# Patient Record
Sex: Male | Born: 1935 | Race: Asian | Hispanic: No | Marital: Married | Smoking: Former smoker
Health system: Southern US, Community
[De-identification: ages and names within clinical notes are randomized; demographics above are authoritative.]

## PROBLEM LIST (undated history)

## (undated) DIAGNOSIS — H9193 Unspecified hearing loss, bilateral: Secondary | ICD-10-CM

## (undated) DIAGNOSIS — F411 Generalized anxiety disorder: Secondary | ICD-10-CM

## (undated) DIAGNOSIS — K5792 Diverticulitis of intestine, part unspecified, without perforation or abscess without bleeding: Secondary | ICD-10-CM

## (undated) DIAGNOSIS — E785 Hyperlipidemia, unspecified: Secondary | ICD-10-CM

## (undated) DIAGNOSIS — K589 Irritable bowel syndrome without diarrhea: Secondary | ICD-10-CM

## (undated) DIAGNOSIS — F329 Major depressive disorder, single episode, unspecified: Secondary | ICD-10-CM

## (undated) DIAGNOSIS — R7302 Impaired glucose tolerance (oral): Secondary | ICD-10-CM

## (undated) DIAGNOSIS — G4700 Insomnia, unspecified: Secondary | ICD-10-CM

## (undated) DIAGNOSIS — R64 Cachexia: Secondary | ICD-10-CM

## (undated) DIAGNOSIS — N400 Enlarged prostate without lower urinary tract symptoms: Secondary | ICD-10-CM

## (undated) HISTORY — DX: Insomnia, unspecified: G47.00

## (undated) HISTORY — DX: Irritable bowel syndrome without diarrhea: K58.9

## (undated) HISTORY — DX: Cachexia: R64

## (undated) HISTORY — DX: Generalized anxiety disorder: F41.1

## (undated) HISTORY — DX: Unspecified hearing loss, bilateral: H91.93

## (undated) HISTORY — DX: Impaired glucose tolerance (oral): R73.02

## (undated) HISTORY — DX: Benign prostatic hyperplasia without lower urinary tract symptoms: N40.0

## (undated) HISTORY — DX: Diverticulitis of intestine, part unspecified, without perforation or abscess without bleeding: K57.92

## (undated) HISTORY — DX: Hyperlipidemia, unspecified: E78.5

## (undated) HISTORY — DX: Major depressive disorder, single episode, unspecified: F32.9

## (undated) HISTORY — PX: PROSTATE SURGERY: SHX751

## (undated) HISTORY — PX: SHOULDER SURGERY: SHX246

---

## 1999-05-06 ENCOUNTER — Emergency Department (HOSPITAL_COMMUNITY): Admission: EM | Admit: 1999-05-06 | Discharge: 1999-05-06 | Payer: Self-pay | Admitting: Emergency Medicine

## 2003-08-16 ENCOUNTER — Encounter: Admission: RE | Admit: 2003-08-16 | Discharge: 2003-11-14 | Payer: Self-pay | Admitting: Orthopaedic Surgery

## 2010-05-13 ENCOUNTER — Emergency Department (HOSPITAL_COMMUNITY): Admission: EM | Admit: 2010-05-13 | Discharge: 2010-05-13 | Payer: Self-pay | Admitting: Internal Medicine

## 2013-01-25 ENCOUNTER — Telehealth: Payer: Self-pay | Admitting: Internal Medicine

## 2013-01-25 NOTE — Telephone Encounter (Signed)
Ok with me, this time only, thanks

## 2013-01-25 NOTE — Telephone Encounter (Signed)
Pt called stated his friend, Jacob Mason, is a pt of Dr. Jonny Mason and last ov he had with Dr. Jonny Mason Mr. Jacob Mason ask if Dr. Jonny Mason would take on Jacob Mason as a new pt and Dr. Jonny Mason said yes. Please advise if this is ok.

## 2013-01-26 NOTE — Telephone Encounter (Signed)
LMOM TO CALL  

## 2013-01-27 NOTE — Telephone Encounter (Signed)
Appt on July 17

## 2013-02-23 ENCOUNTER — Encounter: Payer: Self-pay | Admitting: Internal Medicine

## 2013-02-23 ENCOUNTER — Other Ambulatory Visit (INDEPENDENT_AMBULATORY_CARE_PROVIDER_SITE_OTHER): Payer: Medicare Other

## 2013-02-23 ENCOUNTER — Ambulatory Visit (INDEPENDENT_AMBULATORY_CARE_PROVIDER_SITE_OTHER): Payer: Medicare Other | Admitting: Internal Medicine

## 2013-02-23 ENCOUNTER — Ambulatory Visit (INDEPENDENT_AMBULATORY_CARE_PROVIDER_SITE_OTHER)
Admission: RE | Admit: 2013-02-23 | Discharge: 2013-02-23 | Disposition: A | Discharge status: Home / Self Care | Payer: Medicare Other | Source: Ambulatory Visit | Attending: Internal Medicine | Admitting: Internal Medicine

## 2013-02-23 VITALS — BP 102/62 | HR 55 | Temp 97.6°F | Ht 63.0 in | Wt 103.8 lb

## 2013-02-23 DIAGNOSIS — R634 Abnormal weight loss: Secondary | ICD-10-CM

## 2013-02-23 DIAGNOSIS — F411 Generalized anxiety disorder: Secondary | ICD-10-CM

## 2013-02-23 DIAGNOSIS — N400 Enlarged prostate without lower urinary tract symptoms: Secondary | ICD-10-CM

## 2013-02-23 DIAGNOSIS — H9193 Unspecified hearing loss, bilateral: Secondary | ICD-10-CM

## 2013-02-23 DIAGNOSIS — Z0001 Encounter for general adult medical examination with abnormal findings: Secondary | ICD-10-CM | POA: Insufficient documentation

## 2013-02-23 DIAGNOSIS — K5792 Diverticulitis of intestine, part unspecified, without perforation or abscess without bleeding: Secondary | ICD-10-CM | POA: Insufficient documentation

## 2013-02-23 DIAGNOSIS — R109 Unspecified abdominal pain: Secondary | ICD-10-CM

## 2013-02-23 HISTORY — DX: Unspecified hearing loss, bilateral: H91.93

## 2013-02-23 HISTORY — DX: Generalized anxiety disorder: F41.1

## 2013-02-23 LAB — CBC WITH DIFFERENTIAL/PLATELET
Basophils Relative: 0.3 % (ref 0.0–3.0)
Eosinophils Absolute: 0.1 10*3/uL (ref 0.0–0.7)
Eosinophils Relative: 1.1 % (ref 0.0–5.0)
HCT: 40.4 % (ref 39.0–52.0)
Lymphs Abs: 1.1 10*3/uL (ref 0.7–4.0)
MCHC: 34.3 g/dL (ref 30.0–36.0)
MCV: 96.9 fl (ref 78.0–100.0)
Monocytes Absolute: 0.4 10*3/uL (ref 0.1–1.0)
Neutrophils Relative %: 65 % (ref 43.0–77.0)
Platelets: 197 10*3/uL (ref 150.0–400.0)
RBC: 4.17 Mil/uL — ABNORMAL LOW (ref 4.22–5.81)
WBC: 4.7 10*3/uL (ref 4.5–10.5)

## 2013-02-23 LAB — URINALYSIS, ROUTINE W REFLEX MICROSCOPIC
Bilirubin Urine: NEGATIVE
Ketones, ur: NEGATIVE
RBC / HPF: NONE SEEN (ref 0–?)
Specific Gravity, Urine: 1.025 (ref 1.000–1.030)
Total Protein, Urine: NEGATIVE
pH: 7 (ref 5.0–8.0)

## 2013-02-23 LAB — BASIC METABOLIC PANEL
Chloride: 101 mEq/L (ref 96–112)
GFR: 114.75 mL/min (ref >60.00)
Potassium: 4.3 mEq/L (ref 3.5–5.1)

## 2013-02-23 LAB — PSA: PSA: 0.72 ng/mL (ref 0.10–4.00)

## 2013-02-23 LAB — HEPATIC FUNCTION PANEL
ALT: 22 U/L (ref 0–53)
AST: 27 U/L (ref 0–37)
Bilirubin, Direct: 0 mg/dL (ref 0.0–0.3)
Total Bilirubin: 0.7 mg/dL (ref 0.3–1.2)

## 2013-02-23 LAB — LIPASE: Lipase: 22 U/L (ref 11.0–59.0)

## 2013-02-23 MED ORDER — FINASTERIDE 5 MG PO TABS: 5.0000 mg | ORAL_TABLET | Freq: Every day | ORAL | Status: DC

## 2013-02-23 MED ORDER — MIRTAZAPINE 15 MG PO TABS: 15.0000 mg | ORAL_TABLET | Freq: Every day | ORAL | Status: DC

## 2013-02-23 NOTE — Assessment & Plan Note (Signed)
To cont remeron for now, stable overall by history and exam, and pt to continue medical treatment as before,  to f/u any worsening symptoms or concerns

## 2013-02-23 NOTE — Assessment & Plan Note (Signed)
C/w functional pain most likely, for labs today and refer GI per pt request

## 2013-02-23 NOTE — Assessment & Plan Note (Signed)
Suspect geriatric decline, but for cxr today as well

## 2013-02-23 NOTE — Assessment & Plan Note (Signed)
stable overall by history and exam,  and pt to continue medical treatment as before,  to f/u any worsening symptoms or concerns, for med refill 

## 2013-02-23 NOTE — Patient Instructions (Addendum)
Please continue all other medications as before, and refills have been done if requested. Please have the pharmacy call with any other refills you may need. Please continue your efforts at being more active, low cholesterol diet, and weight control. You will be contacted regarding the referral for: GI Please go to the LAB in the Basement (turn left off the elevator) for the tests to be done today Please go to the XRAY Department in the Basement (go straight as you get off the elevator) for the x-ray testing You will be contacted by phone if any changes need to be made immediately.  Otherwise, you will receive a letter about your results with an explanation, but please check with MyChart first.  Please remember to sign up for My Chart if you have not done so, as this will be important to you in the future with finding out test results, communicating by private email, and scheduling acute appointments online when needed.  Please return in 6 months, or sooner if needed

## 2013-02-23 NOTE — Progress Notes (Signed)
Subjective:    Patient ID: Jacob Mason, male    DOB: [birth year], 79 y.o.   MRN: [id number]  HPI  Here to establish as new pt;  Having some difficulty maintaining wt as has discomfort more in lower abd with eating, sometimes has to to have BM twice per day, unusual for him, no dysphagia, n/v, radiation, melena; has intermittent constipation and occas small hematochezia (1-2 times per mo) for several months.  Overall states GI complaints as above have been intermittent for 30 yrs.  Has seen GI at [facility],  Had "radioactive egg" test but test showed "too fast" results. Tried otc nexium for 10 days before coming here, no help. Thinks he has lost possibly 30 lbs over many yrs (was about 140 in past yrs), possibly 10 of that in the past yr.  Has had several EGD and colonscopy in past, "ther's nothing wrong with you" and suggested more functional and emotional related; was tx with mirtazipine but not helping GI complaints. Does help sleeping for several yrs.  Former smoker but not recent, drinks few ETOH - 1 drink with dinner some nights only.  No other complaints.  Takes the finasteride for BPH, overall doing ok, gets up one time per night.  His urologist died so not seeing now.  Tries to exercise on a regular basis with walking, cant gain wt. No longer seeing a GI in [place] he saw many yrs ago.  Denies worsening depressive symptoms, suicidal ideation Past Medical History  Diagnosis Date  . Diverticulitis   . BPH (benign prostatic hypertrophy)   . Bilateral hearing loss 02/23/2013   Past Surgical History  Procedure Laterality Date  . Prostate surgery    . Shoulder surgery      reports that he has quit smoking. He does not have any smokeless tobacco history on file. He reports that  drinks alcohol. He reports that he does not use illicit drugs. family history is not on file. Family history is unknown by patient. No Known Allergies No current outpatient prescriptions on file prior to visit.    No current facility-administered medications on file prior to visit.   Review of Systems Constitutional: Negative for diaphoresis, activity change, appetite change or unexpected weight change.  HENT: Negative for hearing loss, ear pain, facial swelling, mouth sores and neck stiffness.   Eyes: Negative for pain, redness and visual disturbance.  Respiratory: Negative for shortness of breath and wheezing.   Cardiovascular: Negative for chest pain and palpitations.  Gastrointestinal: Negative for diarrhea, blood in stool, abdominal distention or other pain Genitourinary: Negative for hematuria, flank pain or change in urine volume.  Musculoskeletal: Negative for myalgias and joint swelling.  Skin: Negative for color change and wound.  Neurological: Negative for syncope and numbness. other than noted Hematological: Negative for adenopathy.  Psychiatric/Behavioral: Negative for hallucinations, self-injury, decreased concentration and agitation.      Objective:   Physical Exam BP 102/62  Pulse 55  Temp(Src) 97.6 F (36.4 C) (Oral)  Ht 5\' 3"  (1.6 m)  Wt 103 lb 12 oz (47.061 kg)  BMI 18.38 kg/m2  SpO2 97% VS noted,  Constitutional: Pt is oriented to person, place, and time. Appears well-developed and well-nourished.  Head: Normocephalic and atraumatic.  Right Ear: External ear normal.  Left Ear: External ear normal.  Nose: Nose normal.  Mouth/Throat: Oropharynx is clear and moist.  Eyes: Conjunctivae and EOM are normal. Pupils are equal, round, and reactive to light.  Neck: Normal range of motion.  Neck supple. No JVD present. No tracheal deviation present.  Cardiovascular: Normal rate, regular rhythm, normal heart sounds and intact distal pulses.   Pulmonary/Chest: Effort normal and breath sounds normal.  Abdominal: Soft. Bowel sounds are normal. There is no tenderness. No HSM  Musculoskeletal: Normal range of motion. Exhibits no edema.  Lymphadenopathy:  Has no cervical  adenopathy.  Neurological: Pt is alert and oriented to person, place, and time. Pt has normal reflexes. No cranial nerve deficit.  Skin: Skin is warm and dry. No rash noted.  Psychiatric:  Has  normal mood and affect. Behavior is normal. mild nervous    Assessment & Plan:

## 2013-03-06 ENCOUNTER — Encounter: Payer: Self-pay | Admitting: Gastroenterology

## 2013-03-14 ENCOUNTER — Encounter: Payer: Self-pay | Admitting: Internal Medicine

## 2013-03-14 DIAGNOSIS — R7302 Impaired glucose tolerance (oral): Secondary | ICD-10-CM

## 2013-03-14 DIAGNOSIS — K589 Irritable bowel syndrome without diarrhea: Secondary | ICD-10-CM

## 2013-03-14 DIAGNOSIS — G4700 Insomnia, unspecified: Secondary | ICD-10-CM

## 2013-03-14 DIAGNOSIS — F32A Depression, unspecified: Secondary | ICD-10-CM

## 2013-03-14 DIAGNOSIS — R64 Cachexia: Secondary | ICD-10-CM

## 2013-03-14 DIAGNOSIS — F329 Major depressive disorder, single episode, unspecified: Secondary | ICD-10-CM | POA: Insufficient documentation

## 2013-03-14 HISTORY — DX: Irritable bowel syndrome, unspecified: K58.9

## 2013-03-14 HISTORY — DX: Insomnia, unspecified: G47.00

## 2013-03-14 HISTORY — DX: Cachexia: R64

## 2013-03-14 HISTORY — DX: Depression, unspecified: F32.A

## 2013-03-14 HISTORY — DX: Impaired glucose tolerance (oral): R73.02

## 2013-03-21 ENCOUNTER — Other Ambulatory Visit (INDEPENDENT_AMBULATORY_CARE_PROVIDER_SITE_OTHER): Payer: Medicare Other

## 2013-03-21 ENCOUNTER — Ambulatory Visit (INDEPENDENT_AMBULATORY_CARE_PROVIDER_SITE_OTHER): Payer: Medicare Other | Admitting: Gastroenterology

## 2013-03-21 ENCOUNTER — Encounter: Payer: Self-pay | Admitting: Gastroenterology

## 2013-03-21 VITALS — BP 110/60 | HR 72 | Ht 63.0 in | Wt 102.0 lb

## 2013-03-21 DIAGNOSIS — R142 Eructation: Secondary | ICD-10-CM

## 2013-03-21 DIAGNOSIS — R141 Gas pain: Secondary | ICD-10-CM

## 2013-03-21 DIAGNOSIS — R140 Abdominal distension (gaseous): Secondary | ICD-10-CM

## 2013-03-21 LAB — IBC PANEL
Iron: 142 ug/dL (ref 42–165)
Transferrin: 245.4 mg/dL (ref 212.0–360.0)

## 2013-03-21 LAB — FERRITIN: Ferritin: 75.2 ng/mL (ref 22.0–322.0)

## 2013-03-21 NOTE — Patient Instructions (Signed)
It has been recommended to you by your physician that you have a(n) Endoscopy and Colonoscopy completed. Per your request, we did not schedule the procedure(s) today. Please contact our office at [phone] should you decide to have the procedure completed.  Your physician has requested that you go to the basement for the following lab work before leaving today: Anemia Panel  Celiac Panel  Information on Gastroparesis is below ________________________________________________________________________  Gastroparesis  Gastroparesis is also called slowed stomach emptying (delayed gastric emptying). It is a condition in which the stomach takes too long to empty its contents. It often happens in people with diabetes.  CAUSES  Gastroparesis happens when nerves to the stomach are damaged or stop working. When the nerves are damaged, the muscles of the stomach and intestines do not work normally. The movement of food is slowed or stopped. High blood glucose (sugar) causes changes in nerves and can damage the blood vessels that carry oxygen and nutrients to the nerves. RISK FACTORS  Diabetes.  Post-viral syndromes.  Eating disorders (anorexia, bulimia).  Surgery on the stomach or vagus nerve.  Gastroesophageal reflux disease (rarely).  Smooth muscle disorders (amyloidosis, scleroderma).  Metabolic disorders, including hypothyroidism.  Parkinson's disease. SYMPTOMS   Heartburn.  Feeling sick to your stomach (nausea).  Vomiting of undigested food.  An early feeling of fullness when eating.  Weight loss.  Abdominal bloating.  Erratic blood glucose levels.  Lack of appetite.  Gastroesophageal reflux.  Spasms of the stomach wall. Complications can include:  Bacterial overgrowth in stomach. Food stays in the stomach and can ferment and cause bacteria to grow.  Weight loss due to difficulty digesting and absorbing nutrients.  Vomiting.  Obstruction in the stomach.  Undigested food can harden and cause nausea and vomiting.  Blood glucose fluctuations caused by inconsistent food absorption. DIAGNOSIS  The diagnosis of gastroparesis is confirmed through one or more of the following tests:  Barium X-rays and scans. These tests look at how long it takes for food to move through the stomach.  Gastric manometry. This test measures electrical and muscular activity in the stomach. A thin tube is passed down the throat into the stomach. The tube contains a wire that takes measurements of the stomach's electrical and muscular activity as it digests liquids and solid food.  Endoscopy. This procedure is done with a long, thin tube called an endoscope. It is passed through the mouth and gently guides down the esophagus into the stomach. This tube helps the caregiver look at the lining of the stomach to check for any abnormalities.  Ultrasound. This can rule out gallbladder disease or pancreatitis. This test will outline and define the shape of the gallbladder and pancreas. TREATMENT   The primary treatment is to identify the problem and help control blood glucose levels. Treatments include:  Exercise.  Medicines to control nausea and vomiting.  Medicines to stimulate stomach muscles.  Changes in what and when you eat.  Having smaller meals more often.  Eating low-fiber forms of high-fiber foods, such aseating cooked vegetables instead of raw vegetables.  Eating low-fat foods.  Consuming liquids, which are easier to digest.  In severe cases, feeding tubes and intravenous (IV) feeding may be needed. It is important to note that in most cases, treatment does not cure gastroparesis. It is usually a lasting (chronic) condition. Treatment helps you manage the condition so that you can be as healthy and comfortable as possible. NEW TREATMENTS  A gastric neurostimulator has been developed to assist  people with gastroparesis. The battery-operated device is  surgically implanted. It emits mild electrical pulses to help improve stomach emptying and to control nausea and vomiting.  The use of botulinum toxin has been shown to improve stomach emptying by decreasing the prolonged contractions of the muscle between the stomach and the small intestine (pyloric sphincter). The benefits are temporary. SEEK MEDICAL CARE IF:   You are having problems keeping your blood glucose in goal range.  You are having nausea, vomiting, bloating, or early feelings of fullness with eating.  Your symptoms do not change with a change in diet. Document Released: 07/27/2005 Document Revised: 10/19/2011 Document Reviewed: 01/03/2009 [facility] Patient Information 2014 [place], [place].

## 2013-03-21 NOTE — Progress Notes (Signed)
History of Present Illness:  This is a 79 year old Korean-American retired patient who has had GI problems for over 30 years evaluated previously by Dr. Dorna Bloom at [facility] including several endoscopies and colonoscopies.  I do not have these records for review.  The patient now relates that he has early satiety, questionable weight loss over 30 years, minimal gas and bloating, no change in his bowel habits, no dysphagia, no hepatobiliary complaints.  He specific denies melena, hematochezia, or any systemic complaints such as fever, chills, skin rash, joint pains et Karie Soda.  He has questionable lactose intolerance, and denies other food intolerances.  He says that his early satiety is approved by constant exercise during the day.  He sleeps well at night.  Patient denies abuse of alcohol, cigarettes, or NSAIDs.  He takes Remeron 15 mg a day, and a variety of supplements.  No history of NSAIDs, alcohol, or cigarette abuse at this time.  I have reviewed this patient's present history, medical and surgical past history, allergies and medications.     ROS:   All systems were reviewed and are negative unless otherwise stated in the HPI.  No Known Allergies Outpatient Prescriptions Prior to Visit  Medication Sig Dispense Refill  . calcium gluconate 500 MG tablet Take 500 mg by mouth daily.      . finasteride (PROSCAR) 5 MG tablet Take 1 tablet (5 mg total) by mouth daily.  90 tablet  3  . Flax OIL Take by mouth daily.      . Lutein 20 MG TABS Take by mouth daily.      . mirtazapine (REMERON) 15 MG tablet Take 1 tablet (15 mg total) by mouth at bedtime.  90 tablet  3  . Multiple Vitamin (MULTIVITAMIN) tablet Take 1 tablet by mouth daily.      . Cholecalciferol (VITAMIN D3) 2000 UNITS TABS Take by mouth daily.       No facility-administered medications prior to visit.   Past Medical History  Diagnosis Date  . Diverticulitis   . BPH (benign prostatic hypertrophy)   . Bilateral hearing loss  02/23/2013  . Anxiety state, unspecified 02/23/2013  . Irritable bowel 03/14/2013  . Insomnia 03/14/2013  . Depression 03/14/2013  . Impaired glucose tolerance 03/14/2013  . Cachexia 03/14/2013   Past Surgical History  Procedure Laterality Date  . Prostate surgery    . Shoulder surgery     History   Social History  . Marital Status: Married    Spouse Name: N/A    Number of Children: N/A  . Years of Education: PHD   Occupational History  . Retired    Social History Main Topics  . Smoking status: Former Games developer  . Smokeless tobacco: Never Used  . Alcohol Use: Yes  . Drug Use: No  . Sexually Active: None   Other Topics Concern  . None   Social History Narrative  . None   History reviewed. No pertinent family history.     Physical Exam: Blood pressure 110/60, pulse 72 and regular weight 182 with a BMI of 18.07.  He is a former smoker General well developed well nourished patient in no acute distress, appearing their stated age Eyes PERRLA, no icterus, fundoscopic exam per opthamologist Skin no lesions noted Neck supple, no adenopathy, no thyroid enlargement, no tenderness Chest clear to percussion and auscultation Heart no significant murmurs, gallops or rubs noted Abdomen no hepatosplenomegaly masses or tenderness, BS normal.  Rectal inspection normal no fissures, or fistulae  noted.  No masses or tenderness on digital exam. Stool guaiac negative.  No abdominal distention, organomegaly, masses or tenderness.  Bowel sounds are entirely normal. Extremities no acute joint lesions, edema, phlebitis or evidence of cellulitis. Neurologic patient oriented x 3, cranial nerves intact, no focal neurologic deficits noted. Psychological mental status normal and normal affect.  Assessment and plan: Probable mild gastroparesis which is been a chronic problem for this patient over 30 years.  I am willing to repeat GI evaluation with endoscopy and colonoscopy, but he has refused these tests.  He  is convinced that he has rapid intestinal emptying has previously told by Dr. Dorna Bloom.  He has no worrisome red flag signals of malignancy, and I have not pushed him to have any particular test done, but we will check celiac serology and I placed him on a  gastroparesis diet trial.  Apparently, in the past he has tried probiotics without improvement.  It may be best that this patient return to [facility] for his ongoing GI care.  I doubt he has underlying occult malignancy.  Review of his labs shows normal CBC and general metabolic and liver profiles.  He has never had hepatitis, pancreatitis, or hepatobiliary problems.

## 2013-03-22 LAB — CELIAC PANEL 10
Gliadin IgG: 11.2 U/mL (ref <20)
Tissue Transglut Ab: 6.3 U/mL (ref <20)
Tissue Transglutaminase Ab, IgA: 3.3 U/mL (ref <20)

## 2013-04-12 ENCOUNTER — Telehealth: Payer: Self-pay | Admitting: Gastroenterology

## 2013-04-13 ENCOUNTER — Telehealth: Payer: Self-pay | Admitting: Gastroenterology

## 2013-04-13 NOTE — Telephone Encounter (Signed)
Mailed results  

## 2013-04-13 NOTE — Telephone Encounter (Signed)
In previous note, pt had requested lab results be mailed to him. Done.

## 2013-04-13 NOTE — Telephone Encounter (Signed)
lmom for pt to call back

## 2013-06-15 ENCOUNTER — Other Ambulatory Visit: Payer: Self-pay

## 2013-09-19 ENCOUNTER — Ambulatory Visit: Payer: Medicare Other | Admitting: Internal Medicine

## 2013-09-20 ENCOUNTER — Ambulatory Visit: Payer: Medicare Other | Admitting: Internal Medicine

## 2013-09-29 ENCOUNTER — Ambulatory Visit: Payer: Medicare Other | Admitting: Internal Medicine

## 2013-10-13 ENCOUNTER — Ambulatory Visit (INDEPENDENT_AMBULATORY_CARE_PROVIDER_SITE_OTHER): Payer: Medicare Other | Admitting: Internal Medicine

## 2013-10-13 ENCOUNTER — Encounter: Payer: Self-pay | Admitting: Internal Medicine

## 2013-10-13 VITALS — BP 118/70 | HR 70 | Temp 98.4°F | Ht 63.0 in | Wt 107.2 lb

## 2013-10-13 DIAGNOSIS — F411 Generalized anxiety disorder: Secondary | ICD-10-CM

## 2013-10-13 DIAGNOSIS — Z0000 Encounter for general adult medical examination without abnormal findings: Secondary | ICD-10-CM

## 2013-10-13 DIAGNOSIS — F329 Major depressive disorder, single episode, unspecified: Secondary | ICD-10-CM

## 2013-10-13 DIAGNOSIS — F32A Depression, unspecified: Secondary | ICD-10-CM

## 2013-10-13 DIAGNOSIS — R7309 Other abnormal glucose: Secondary | ICD-10-CM

## 2013-10-13 DIAGNOSIS — F3289 Other specified depressive episodes: Secondary | ICD-10-CM

## 2013-10-13 DIAGNOSIS — Z23 Encounter for immunization: Secondary | ICD-10-CM

## 2013-10-13 DIAGNOSIS — R64 Cachexia: Secondary | ICD-10-CM

## 2013-10-13 DIAGNOSIS — R7302 Impaired glucose tolerance (oral): Secondary | ICD-10-CM

## 2013-10-13 NOTE — Assessment & Plan Note (Signed)
stable overall by history and exam, recent data reviewed with pt, and pt to continue medical treatment as before,  to f/u any worsening symptoms or concerns Lab Results  Component Value Date   WBC 4.7 02/23/2013   HGB 13.9 02/23/2013   HCT 40.4 02/23/2013   PLT 197.0 02/23/2013   GLUCOSE 96 02/23/2013   ALT 22 02/23/2013   AST 27 02/23/2013   NA 139 02/23/2013   K 4.3 02/23/2013   CL 101 02/23/2013   CREATININE 0.7 02/23/2013   BUN 16 02/23/2013   CO2 31 02/23/2013   TSH 0.50 02/23/2013   PSA 0.72 02/23/2013

## 2013-10-13 NOTE — Progress Notes (Signed)
Subjective:    Patient ID: Jacob Mason, male    DOB: [birth year], 80 y.o.   MRN: [id number]  HPI  Here to f/u, wt overall stable, saw Dr Patterson/GI - neg eval, then 2nd GI opinion but gastric motility testing neg, and testing for anerobic bact issue (? SB bact overgrowth),  Has gained 4 lbs since last visit. Pt denies chest pain, increased sob or doe, wheezing, orthopnea, PND, increased LE swelling, palpitations, dizziness or syncope.   Pt denies polydipsia, polyuria, Pt denies new neurological symptoms such as new headache, or facial or extremity weakness or numbness. Denies worsening depressive symptoms, suicidal ideation, or panic Past Medical History  Diagnosis Date  . Diverticulitis   . BPH (benign prostatic hypertrophy)   . Bilateral hearing loss 02/23/2013  . Anxiety state, unspecified 02/23/2013  . Irritable bowel 03/14/2013  . Insomnia 03/14/2013  . Depression 03/14/2013  . Impaired glucose tolerance 03/14/2013  . Cachexia 03/14/2013   Past Surgical History  Procedure Laterality Date  . Prostate surgery    . Shoulder surgery      reports that he has quit smoking. He has never used smokeless tobacco. He reports that he drinks alcohol. He reports that he does not use illicit drugs. family history is not on file. No Known Allergies Current Outpatient Prescriptions on File Prior to Visit  Medication Sig Dispense Refill  . calcium gluconate 500 MG tablet Take 500 mg by mouth daily.      . Cholecalciferol (VITAMIN D) 2000 UNITS CAPS Take 1 capsule by mouth daily.      . finasteride (PROSCAR) 5 MG tablet Take 1 tablet (5 mg total) by mouth daily.  90 tablet  3  . Flax OIL Take by mouth daily.      . Lutein 20 MG TABS Take by mouth daily.      . mirtazapine (REMERON) 15 MG tablet Take 1 tablet (15 mg total) by mouth at bedtime.  90 tablet  3  . Multiple Vitamin (MULTIVITAMIN) tablet Take 1 tablet by mouth daily.       No current facility-administered medications on file prior to visit.    Review of Systems  Constitutional: Negative for unexpected weight change, or unusual diaphoresis  HENT: Negative for tinnitus.   Eyes: Negative for photophobia and visual disturbance.  Respiratory: Negative for choking and stridor.   Gastrointestinal: Negative for vomiting and blood in stool.  Genitourinary: Negative for hematuria and decreased urine volume.  Musculoskeletal: Negative for acute joint swelling Skin: Negative for color change and wound.  Neurological: Negative for tremors and numbness other than noted  Psychiatric/Behavioral: Negative for decreased concentration or  hyperactivity.       Objective:   Physical Exam BP 118/70  Pulse 70  Temp(Src) 98.4 F (36.9 C) (Oral)  Ht 5\' 3"  (1.6 m)  Wt 107 lb 4 oz (48.648 kg)  BMI 19.00 kg/m2  SpO2 97% VS noted, not ill appearing Constitutional: Pt appears well-developed and well-nourished.  HENT: Head: NCAT.  Right Ear: External ear normal.  Left Ear: External ear normal.  Eyes: Conjunctivae and EOM are normal. Pupils are equal, round, and reactive to light.  Neck: Normal range of motion. Neck supple.  Cardiovascular: Normal rate and regular rhythm.   Pulmonary/Chest: Effort normal and breath sounds normal.  Abd:  Soft, NT, non-distended, + BS Neurological: Pt is alert. Not confused  Skin: Skin is warm. No erythema.  Psychiatric: Pt behavior is normal. Thought content normal. ? Mild depressed  affect    Assessment & Plan:

## 2013-10-13 NOTE — Progress Notes (Signed)
Pre visit review using our clinic review tool, if applicable. No additional management support is needed unless otherwise documented below in the visit note. 

## 2013-10-13 NOTE — Patient Instructions (Addendum)
You had the new Prevnar pneumonia shot today Please continue all other medications as before, and refills have been done if requested. Please have the pharmacy call with any other refills you may need.  Please continue your efforts at being more active, low cholesterol diet.  You should continue the Ensure for weight.    Please remember to sign up for MyChart if you have not done so, as this will be important to you in the future with finding out test results, communicating by private email, and scheduling acute appointments online when needed.  Please return in 1 year for your yearly visit, or sooner if needed, with Lab testing done 3-5 days before

## 2013-10-13 NOTE — Assessment & Plan Note (Signed)
stable overall by history and exam, and pt to continue medical treatment as before,  to f/u any worsening symptoms or concerns 

## 2013-10-13 NOTE — Assessment & Plan Note (Signed)
Wt up several lbs,  to f/u any worsening symptoms or concerns

## 2014-05-15 ENCOUNTER — Other Ambulatory Visit: Payer: Self-pay

## 2014-05-15 MED ORDER — MIRTAZAPINE 15 MG PO TABS: 15.0000 mg | ORAL_TABLET | Freq: Every day | ORAL | Status: DC

## 2014-05-15 MED ORDER — FINASTERIDE 5 MG PO TABS: 5.0000 mg | ORAL_TABLET | Freq: Every day | ORAL | Status: DC

## 2014-05-15 NOTE — Telephone Encounter (Signed)
remeron done erx - primemail

## 2014-05-29 ENCOUNTER — Ambulatory Visit (INDEPENDENT_AMBULATORY_CARE_PROVIDER_SITE_OTHER): Payer: Medicare Other | Admitting: *Deleted

## 2014-05-29 DIAGNOSIS — Z23 Encounter for immunization: Secondary | ICD-10-CM

## 2014-09-26 ENCOUNTER — Encounter: Payer: Self-pay | Admitting: Internal Medicine

## 2014-09-26 ENCOUNTER — Ambulatory Visit (INDEPENDENT_AMBULATORY_CARE_PROVIDER_SITE_OTHER): Payer: Medicare Other | Admitting: Internal Medicine

## 2014-09-26 VITALS — BP 110/64 | HR 68 | Temp 97.6°F | Wt 104.0 lb

## 2014-09-26 DIAGNOSIS — F32A Depression, unspecified: Secondary | ICD-10-CM

## 2014-09-26 DIAGNOSIS — R7302 Impaired glucose tolerance (oral): Secondary | ICD-10-CM

## 2014-09-26 DIAGNOSIS — R059 Cough, unspecified: Secondary | ICD-10-CM | POA: Insufficient documentation

## 2014-09-26 DIAGNOSIS — Z0189 Encounter for other specified special examinations: Secondary | ICD-10-CM

## 2014-09-26 DIAGNOSIS — R05 Cough: Secondary | ICD-10-CM

## 2014-09-26 DIAGNOSIS — F329 Major depressive disorder, single episode, unspecified: Secondary | ICD-10-CM

## 2014-09-26 DIAGNOSIS — Z0000 Encounter for general adult medical examination without abnormal findings: Secondary | ICD-10-CM

## 2014-09-26 MED ORDER — LEVOFLOXACIN 250 MG PO TABS: 250.0000 mg | ORAL_TABLET | Freq: Every day | ORAL | Status: DC

## 2014-09-26 NOTE — Assessment & Plan Note (Addendum)
stable overall by history and exam, recent data reviewed with pt, and pt to continue medical treatment as before,  to f/u any worsening symptoms or concerns No results found for: HGBA1C  Lab Results  Component Value Date   WBC 4.7 02/23/2013   HGB 13.9 02/23/2013   HCT 40.4 02/23/2013   PLT 197.0 02/23/2013   GLUCOSE 96 02/23/2013   ALT 22 02/23/2013   AST 27 02/23/2013   NA 139 02/23/2013   K 4.3 02/23/2013   CL 101 02/23/2013   CREATININE 0.7 02/23/2013   BUN 16 02/23/2013   CO2 31 02/23/2013   TSH 0.50 02/23/2013   PSA 0.72 02/23/2013  pt to call for onset polys or cbg > 200

## 2014-09-26 NOTE — Assessment & Plan Note (Signed)
C/w prob bronchitis vs pna, declines cxr, for levaquin asd, delsym otc for cough,  to f/u any worsening symptoms or concerns

## 2014-09-26 NOTE — Progress Notes (Signed)
Pre visit review using our clinic review tool, if applicable. No additional management support is needed unless otherwise documented below in the visit note. 

## 2014-09-26 NOTE — Progress Notes (Signed)
   Subjective:    Patient ID: Jacob Mason, male    DOB: November 04, 1928, 81 y.o.   MRN: [id number]  HPI  Here with acute onset mild to mod 2 wks  ST, HA, general weakness and malaise, with prod cough greenish sputum, but Pt denies chest pain, increased sob or doe, wheezing, orthopnea, PND, increased LE swelling, palpitations, dizziness or syncope.   Pt denies polydipsia, polyuria,Pt states overall good compliance with meds, trying to follow lower cholesterol, diabetic diet, wt overall stable though still o nlow side. Denies worsening depressive symptoms, suicidal ideation, or panic; has ongoing anxiety, not increased recently.    Past Medical History  Diagnosis Date  . Diverticulitis   . BPH (benign prostatic hypertrophy)   . Bilateral hearing loss 02/23/2013  . Anxiety state, unspecified 02/23/2013  . Irritable bowel 03/14/2013  . Insomnia 03/14/2013  . Depression 03/14/2013  . Impaired glucose tolerance 03/14/2013  . Cachexia 03/14/2013   Past Surgical History  Procedure Laterality Date  . Prostate surgery    . Shoulder surgery      reports that he has quit smoking. He has never used smokeless tobacco. He reports that he drinks alcohol. He reports that he does not use illicit drugs. family history is not on file. No Known Allergies Current Outpatient Prescriptions on File Prior to Visit  Medication Sig Dispense Refill  . calcium gluconate 500 MG tablet Take 500 mg by mouth daily.    . Cholecalciferol (VITAMIN D) 2000 UNITS CAPS Take 1 capsule by mouth daily.    . Flax OIL Take by mouth daily.    . Lutein 20 MG TABS Take by mouth daily.    . mirtazapine (REMERON) 15 MG tablet Take 1 tablet (15 mg total) by mouth at bedtime. 90 tablet 3  . Multiple Vitamin (MULTIVITAMIN) tablet Take 1 tablet by mouth daily.     No current facility-administered medications on file prior to visit.   Review of Systems  Constitutional: Negative for unusual diaphoresis or other sweats  HENT: Negative for ringing in  ear Eyes: Negative for double vision or worsening visual disturbance.  Respiratory: Negative for choking and stridor.   Gastrointestinal: Negative for vomiting or other signifcant bowel change Genitourinary: Negative for hematuria or decreased urine volume.  Musculoskeletal: Negative for other MSK pain or swelling Skin: Negative for color change and worsening wound.  Neurological: Negative for tremors and numbness other than noted  Psychiatric/Behavioral: Negative for decreased concentration or agitation other than above       Objective:   Physical Exam BP 110/64 mmHg  Pulse 68  Temp(Src) 97.6 F (36.4 C) (Oral)  Wt 104 lb (47.174 kg) VS noted, mild ill Constitutional: Pt appears well-developed, well-nourished.  HENT: Head: NCAT.  Right Ear: External ear normal.  Left Ear: External ear normal.  Eyes: . Pupils are equal, round, and reactive to light. Conjunctivae and EOM are normal Neck: Normal range of motion. Neck supple.  Bilat tm's with mild erythema.  Max sinus areas non tender.  Pharynx with mild erythema, no exudate Cardiovascular: Normal rate and regular rhythm.   Pulmonary/Chest: Effort normal and breath sounds somewhat decreased, without rales or wheezing.  Neurological: Pt is alert. Not confused , motor grossly intact Skin: Skin is warm. No rash Psychiatric: Pt behavior is normal. No agitation. not depressed affect     Assessment & Plan:

## 2014-09-26 NOTE — Assessment & Plan Note (Signed)
stable overall by history and exam, and pt to continue medical treatment as before,  to f/u any worsening symptoms or concerns 

## 2014-09-26 NOTE — Patient Instructions (Signed)
Please take all new medication as prescribed - the antibiotic  You can also take Delsym OTC for cough, and/or Mucinex (or it's generic off brand) for congestion, and tylenol as needed for pain.  Please continue all other medications as before, and refills have been done if requested.  Please have the pharmacy call with any other refills you may need.  Please keep your appointments with your specialists as you may have planned  Please return in 3 months, or sooner if needed, with Lab testing done 3-5 days before  

## 2014-10-17 ENCOUNTER — Encounter: Payer: Medicare Other | Admitting: Internal Medicine

## 2014-11-28 ENCOUNTER — Telehealth: Payer: Self-pay

## 2014-11-28 NOTE — Telephone Encounter (Signed)
Patient called to educate on Medicare Wellness apt. LVM for the patient to call back to educate and schedule for wellness visit.   

## 2014-12-25 ENCOUNTER — Other Ambulatory Visit (INDEPENDENT_AMBULATORY_CARE_PROVIDER_SITE_OTHER): Payer: Medicare Other

## 2014-12-25 DIAGNOSIS — Z0000 Encounter for general adult medical examination without abnormal findings: Secondary | ICD-10-CM

## 2014-12-25 DIAGNOSIS — Z0189 Encounter for other specified special examinations: Secondary | ICD-10-CM | POA: Diagnosis not present

## 2014-12-25 LAB — URINALYSIS, ROUTINE W REFLEX MICROSCOPIC
Bilirubin Urine: NEGATIVE
Hgb urine dipstick: NEGATIVE
Ketones, ur: NEGATIVE
Leukocytes, UA: NEGATIVE
Nitrite: NEGATIVE
Specific Gravity, Urine: 1.02 (ref 1.000–1.030)
Total Protein, Urine: NEGATIVE
UROBILINOGEN UA: 0.2 (ref 0.0–1.0)
Urine Glucose: NEGATIVE
pH: 6 (ref 5.0–8.0)

## 2014-12-25 LAB — LIPID PANEL
CHOL/HDL RATIO: 3
Cholesterol: 210 mg/dL — ABNORMAL HIGH (ref 0–200)
HDL: 80.9 mg/dL (ref >39.00)
LDL Cholesterol: 118 mg/dL — ABNORMAL HIGH (ref 0–99)
NONHDL: 129.1
Triglycerides: 58 mg/dL (ref 0.0–149.0)
VLDL: 11.6 mg/dL (ref 0.0–40.0)

## 2014-12-25 LAB — HEPATIC FUNCTION PANEL
ALT: 16 U/L (ref 0–53)
AST: 27 U/L (ref 0–37)
Albumin: 4.3 g/dL (ref 3.5–5.2)
Alkaline Phosphatase: 59 U/L (ref 39–117)
BILIRUBIN TOTAL: 0.8 mg/dL (ref 0.2–1.2)
Bilirubin, Direct: 0.1 mg/dL (ref 0.0–0.3)
Total Protein: 7.1 g/dL (ref 6.0–8.3)

## 2014-12-25 LAB — BASIC METABOLIC PANEL
BUN: 17 mg/dL (ref 6–23)
CALCIUM: 9.8 mg/dL (ref 8.4–10.5)
CO2: 33 meq/L — AB (ref 19–32)
Chloride: 103 mEq/L (ref 96–112)
Creatinine, Ser: 0.79 mg/dL (ref 0.40–1.50)
GFR: 99.36 mL/min (ref >60.00)
Glucose, Bld: 109 mg/dL — ABNORMAL HIGH (ref 70–99)
Potassium: 4.3 mEq/L (ref 3.5–5.1)
Sodium: 140 mEq/L (ref 135–145)

## 2014-12-25 LAB — CBC WITH DIFFERENTIAL/PLATELET
BASOS PCT: 0.3 % (ref 0.0–3.0)
Basophils Absolute: 0 10*3/uL (ref 0.0–0.1)
EOS PCT: 0.9 % (ref 0.0–5.0)
Eosinophils Absolute: 0 10*3/uL (ref 0.0–0.7)
HCT: 42.3 % (ref 39.0–52.0)
HEMOGLOBIN: 14 g/dL (ref 13.0–17.0)
LYMPHS ABS: 1.2 10*3/uL (ref 0.7–4.0)
Lymphocytes Relative: 24.1 % (ref 12.0–46.0)
MCHC: 33.2 g/dL (ref 30.0–36.0)
MCV: 94.3 fl (ref 78.0–100.0)
MONOS PCT: 7.7 % (ref 3.0–12.0)
Monocytes Absolute: 0.4 10*3/uL (ref 0.1–1.0)
NEUTROS PCT: 67 % (ref 43.0–77.0)
Neutro Abs: 3.3 10*3/uL (ref 1.4–7.7)
PLATELETS: 231 10*3/uL (ref 150.0–400.0)
RBC: 4.49 Mil/uL (ref 4.22–5.81)
RDW: 14.8 % (ref 11.5–15.5)
WBC: 4.9 10*3/uL (ref 4.0–10.5)

## 2014-12-25 LAB — TSH: TSH: 0.62 u[IU]/mL (ref 0.35–4.50)

## 2014-12-28 ENCOUNTER — Ambulatory Visit (INDEPENDENT_AMBULATORY_CARE_PROVIDER_SITE_OTHER): Payer: Medicare Other | Admitting: Internal Medicine

## 2014-12-28 ENCOUNTER — Encounter: Payer: Self-pay | Admitting: Internal Medicine

## 2014-12-28 VITALS — BP 116/70 | HR 63 | Temp 98.6°F | Resp 18 | Ht 63.0 in | Wt 101.0 lb

## 2014-12-28 DIAGNOSIS — Z23 Encounter for immunization: Secondary | ICD-10-CM | POA: Diagnosis not present

## 2014-12-28 DIAGNOSIS — Z0000 Encounter for general adult medical examination without abnormal findings: Secondary | ICD-10-CM

## 2014-12-28 NOTE — Assessment & Plan Note (Signed)
Overall doing well, age appropriate education and counseling updated, referrals for preventative services and immunizations addressed, dietary and smoking counseling addressed, most recent labs reviewed.  I have personally reviewed and have noted:  1) the patient's medical and social history 2) The pt's use of alcohol, tobacco, and illicit drugs 3) The patient's current medications and supplements 4) Functional ability including ADL's, fall risk, home safety risk, hearing and visual impairment 5) Diet and physical activities 6) Evidence for depression or mood disorder 7) The patient's height, weight, and BMI have been recorded in the chart  I have made referrals, and provided counseling and education based on review of the above Lab Results  Component Value Date   WBC 4.9 12/25/2014   HGB 14.0 12/25/2014   HCT 42.3 12/25/2014   PLT 231.0 12/25/2014   GLUCOSE 109* 12/25/2014   CHOL 210* 12/25/2014   TRIG 58.0 12/25/2014   HDL 80.90 12/25/2014   LDLCALC 118* 12/25/2014   ALT 16 12/25/2014   AST 27 12/25/2014   NA 140 12/25/2014   K 4.3 12/25/2014   CL 103 12/25/2014   CREATININE 0.79 12/25/2014   BUN 17 12/25/2014   CO2 33* 12/25/2014   TSH 0.62 12/25/2014   PSA 0.72 02/23/2013

## 2014-12-28 NOTE — Progress Notes (Signed)
Subjective:    Patient ID: Jacob Mason, male    DOB: 1928-09-09, 81 y.o.   MRN: [id number]  HPI  Here for wellness and f/u;  Overall doing ok;  Pt denies Chest pain, worsening SOB, DOE, wheezing, orthopnea, PND, worsening LE edema, palpitations, dizziness or syncope.  Pt denies neurological change such as new headache, facial or extremity weakness.  Pt denies polydipsia, polyuria, or low sugar symptoms. Pt states overall good compliance with treatment and medications, good tolerability, and has been trying to follow appropriate diet.  Pt denies worsening depressive symptoms, suicidal ideation or panic. No fever, night sweats, loss of appetite, or other constitutional symptoms.  Pt states good ability with ADL's, has low fall risk, home safety reviewed and adequate, no other significant changes in hearing or vision, and only occasionally active with exercise. His chief concern today is of persistent wt loss, has been chronic problem for 50 yrs, Has seen several MD in 80's and 90's , Gi eval neg and suggested he should try remeron for sleep, depression, and wt gain.  Wt Readings from Last 3 Encounters:  12/28/14 101 lb 0.6 oz (45.831 kg)  09/26/14 104 lb (47.174 kg)  10/13/13 107 lb 4 oz (48.648 kg)  Now down to 101 lbs. Last seen per GI per Dr Jarold MottoPatterson, who per pt suggested repeat endoscopy/colon but pt did not agree at the time.  [facility], had motility study normal.  Also checked for h pylori it seems by his description, but neg.  Told he was "OK" but pt adamant there is something wrong.  Does try to stay active and abhors sitting at computer too much.  Mother was thin as well.  Past Medical History  Diagnosis Date  . Diverticulitis   . BPH (benign prostatic hypertrophy)   . Bilateral hearing loss 02/23/2013  . Anxiety state, unspecified 02/23/2013  . Irritable bowel 03/14/2013  . Insomnia 03/14/2013  . Depression 03/14/2013  . Impaired glucose tolerance 03/14/2013  . Cachexia  03/14/2013   Past Surgical History  Procedure Laterality Date  . Prostate surgery    . Shoulder surgery      reports that he has quit smoking. He has never used smokeless tobacco. He reports that he drinks alcohol. He reports that he does not use illicit drugs. family history is not on file. No Known Allergies Current Outpatient Prescriptions on File Prior to Visit  Medication Sig Dispense Refill  . calcium gluconate 500 MG tablet Take 500 mg by mouth daily.    . Cholecalciferol (VITAMIN D) 2000 UNITS CAPS Take 1 capsule by mouth daily.    . Flax OIL Take by mouth daily.    . Lutein 20 MG TABS Take by mouth daily.    . mirtazapine (REMERON) 15 MG tablet Take 1 tablet (15 mg total) by mouth at bedtime. 90 tablet 3  . Multiple Vitamin (MULTIVITAMIN) tablet Take 1 tablet by mouth daily.     No current facility-administered medications on file prior to visit.   Review of Systems Constitutional: Negative for increased diaphoresis, other activity, appetite or siginficant weight change other than noted HENT: Negative for worsening hearing loss, ear pain, facial swelling, mouth sores and neck stiffness.   Eyes: Negative for other worsening pain, redness or visual disturbance.  Respiratory: Negative for shortness of breath and wheezing  Cardiovascular: Negative for chest pain and palpitations.  Gastrointestinal: Negative for diarrhea, blood in stool, abdominal distention or other pain Genitourinary:  Negative for hematuria, flank pain or change in urine volume.  Musculoskeletal: Negative for myalgias or other joint complaints.  Skin: Negative for color change and wound or drainage.  Neurological: Negative for syncope and numbness. other than noted Hematological: Negative for adenopathy. or other swelling Psychiatric/Behavioral: Negative for hallucinations, SI, self-injury, decreased concentration or other worsening agitation.      Objective:   Physical Exam BP 116/70 mmHg  Pulse 63   Temp(Src) 98.6 F (37 C) (Oral)  Resp 18  Ht 5\' 3"  (1.6 m)  Wt 101 lb 0.6 oz (45.831 kg)  BMI 17.90 kg/m2  SpO2 97% VS noted,  Constitutional: Pt is oriented to person, place, and time. Appears well-developed and well-nourished, in no significant distress Head: Normocephalic and atraumatic.  Right Ear: External ear normal.  Left Ear: External ear normal.  Nose: Nose normal.  Mouth/Throat: Oropharynx is clear and moist.  Eyes: Conjunctivae and EOM are normal. Pupils are equal, round, and reactive to light.  Neck: Normal range of motion. Neck supple. No JVD present. No tracheal deviation present or significant neck LA or mass Cardiovascular: Normal rate, regular rhythm, normal heart sounds and intact distal pulses.   Pulmonary/Chest: Effort normal and breath sounds without rales or wheezing  Abdominal: Soft. Bowel sounds are normal. NT. No HSM  Musculoskeletal: Normal range of motion. Exhibits no edema.  Lymphadenopathy:  Has no cervical adenopathy.  Neurological: Pt is alert and oriented to person, place, and time. Pt has normal reflexes. No cranial nerve deficit. Motor grossly intact Skin: Skin is warm and dry. No rash noted.  Psychiatric:  Has normal mood and affect. Behavior is normal.     Assessment & Plan:

## 2014-12-28 NOTE — Patient Instructions (Addendum)
You had the Tdap (tetanus) and the Pneumovax pneumonia shots today  You can also take extra Ensure OTC such as 1 can three times per day between meals to help with maintaining weight  Please continue all other medications as before, and refills have been done if requested.  Please have the pharmacy call with any other refills you may need.  Please continue your efforts at being more active, low cholesterol diet, and weight control.  You are otherwise up to date with prevention measures today.  Please keep your appointments with your specialists as you may have planned  Please return in 1 year for your yearly visit, or sooner if needed, with Lab testing done 3-5 days before

## 2014-12-28 NOTE — Addendum Note (Signed)
Addended by: Maurene CapesPOTTS, Anzleigh Slaven M on: 12/28/2014 01:48 PM   Modules accepted: Orders

## 2014-12-28 NOTE — Progress Notes (Signed)
Pre visit review using our clinic review tool, if applicable. No additional management support is needed unless otherwise documented below in the visit note. 

## 2015-03-14 ENCOUNTER — Ambulatory Visit (INDEPENDENT_AMBULATORY_CARE_PROVIDER_SITE_OTHER): Payer: Medicare Other | Admitting: Gastroenterology

## 2015-03-14 ENCOUNTER — Encounter: Payer: Self-pay | Admitting: Gastroenterology

## 2015-03-14 VITALS — BP 116/52 | HR 60 | Ht 62.6 in | Wt 99.5 lb

## 2015-03-14 DIAGNOSIS — R64 Cachexia: Secondary | ICD-10-CM | POA: Diagnosis not present

## 2015-03-14 DIAGNOSIS — G4700 Insomnia, unspecified: Secondary | ICD-10-CM | POA: Diagnosis not present

## 2015-03-14 DIAGNOSIS — R634 Abnormal weight loss: Secondary | ICD-10-CM

## 2015-03-14 MED ORDER — LOPERAMIDE HCL 2 MG PO CAPS: ORAL_CAPSULE | ORAL | Status: DC

## 2015-03-14 NOTE — Progress Notes (Signed)
_                                                                                                                History of Present Illness:  Jacob Mason is an 81 year old Asian male former patient of Dr. Eloise Harman here for evaluation of weight loss.  Weight loss, early satiety, bloating followed by loose stools has been a problem for years.  He has undergone extensive evaluation in the past, most recently by Dr. Claire Shown at [facility], including upper and lower endoscopy, gastric emptying scan and CT scan which all apparently have been unremarkable.  Weight has fluctuated between 99 pounds in the 106.  He is somewhat afraid to eat because this causes bloating which is then followed by loose stools.  He is unaware of any specific foods that cause the symptoms.  He has been on Remeron for over 20 years and has recently begun to taper it.   Past Medical History  Diagnosis Date  . Diverticulitis   . BPH (benign prostatic hypertrophy)   . Bilateral hearing loss 02/23/2013  . Anxiety state, unspecified 02/23/2013  . Irritable bowel 03/14/2013  . Insomnia 03/14/2013  . Depression 03/14/2013  . Impaired glucose tolerance 03/14/2013  . Cachexia 03/14/2013   Past Surgical History  Procedure Laterality Date  . Prostate surgery    . Shoulder surgery     family history is not on file. Current Outpatient Prescriptions  Medication Sig Dispense Refill  . calcium gluconate 500 MG tablet Take 500 mg by mouth daily.    . Cholecalciferol (VITAMIN D) 2000 UNITS CAPS Take 1 capsule by mouth daily.    . Flax OIL Take by mouth daily.    . Lutein 20 MG TABS Take by mouth daily.    . mirtazapine (REMERON) 15 MG tablet Take 1 tablet (15 mg total) by mouth at bedtime. 90 tablet 3  . Multiple Vitamin (MULTIVITAMIN) tablet Take 1 tablet by mouth daily.     No current facility-administered medications for this visit.   Allergies as of 03/14/2015  . (No Known Allergies)    reports that he has quit smoking.  He has never used smokeless tobacco. He reports that he drinks alcohol. He reports that he does not use illicit drugs.   Review of Systems: Pertinent positive and negative review of systems were noted in the above HPI section. All other review of systems were otherwise negative.  Vital signs were reviewed in today's medical record Physical Exam: General: Thin male in no acute distress Skin: anicteric Head: Normocephalic and atraumatic Eyes:  sclerae anicteric, EOMI Ears: Normal auditory acuity Mouth: No deformity or lesions Neck: Supple, no masses or thyromegaly Lymph Nodes: no lymphadenopathy Lungs: Clear throughout to auscultation Heart: Regular rate and rhythm; no murmurs, rubs or bruits Gastroinestinal: Soft, non tender and non distended. No masses, hepatosplenomegaly or hernias noted. Normal Bowel sounds.  There is no succussion splash Rectal:deferred Musculoskeletal: Symmetrical with no gross deformities  Skin: No lesions on visible extremities  Pulses:  Normal pulses noted Extremities: No clubbing, cyanosis, edema or deformities noted Neurological: Alert oriented x 4, grossly nonfocal Cervical Nodes:  No significant cervical adenopathy Inguinal Nodes: No significant inguinal adenopathy Psychological:  Alert and cooperative. Normal mood and affect  See Assessment and Plan under Problem List

## 2015-03-14 NOTE — Assessment & Plan Note (Signed)
Weight loss actually has been fairly stable.  His main complaints of postprandial distention and loose stools are probably the reason that he gets his intake.  I think the main problem is dyspepsia related to IBS.  There is no evidence for gastroparesis.  Contrary to using a promotility medication for bloating, it may be worthwhile trying an anticholinergic to decrease his bowel frequency.  Accordingly, I will try him on low-dose Lomotil every morning.

## 2015-03-14 NOTE — Assessment & Plan Note (Signed)
Patient wishes to stop Remeron.  I placed him on a slow taper.

## 2015-03-14 NOTE — Assessment & Plan Note (Signed)
See comments under problem weight loss

## 2015-03-14 NOTE — Patient Instructions (Signed)
We are sending in Lomotil to your pharmacy Decrease Remeron in 1 week to 7.5mg  every other day for 1 week then discontinue Call back with progress in 2 weeks Follow up in 3 months

## 2015-03-28 ENCOUNTER — Telehealth: Payer: Self-pay | Admitting: Gastroenterology

## 2015-03-28 NOTE — Telephone Encounter (Signed)
He should use lomotil prn. OV 4-6 weeks

## 2015-03-28 NOTE — Telephone Encounter (Signed)
Advised. Appointment made.

## 2015-03-28 NOTE — Telephone Encounter (Signed)
He had a decrease in bowel movement frequency with Lomotil. No change in his sensation of abdominal bloating. He has decreased the use of the Lomotil because he is not having daily bowel movements now. He is completely off Remeron.  He does not feel the treatment was completely effective. When would you like to see him?

## 2015-04-30 ENCOUNTER — Encounter: Payer: Self-pay | Admitting: Gastroenterology

## 2015-04-30 ENCOUNTER — Ambulatory Visit (INDEPENDENT_AMBULATORY_CARE_PROVIDER_SITE_OTHER): Payer: Medicare Other

## 2015-04-30 ENCOUNTER — Ambulatory Visit (INDEPENDENT_AMBULATORY_CARE_PROVIDER_SITE_OTHER): Payer: Medicare Other | Admitting: Gastroenterology

## 2015-04-30 VITALS — BP 108/60 | HR 68 | Ht 62.5 in | Wt 97.0 lb

## 2015-04-30 DIAGNOSIS — R634 Abnormal weight loss: Secondary | ICD-10-CM | POA: Diagnosis not present

## 2015-04-30 DIAGNOSIS — Z23 Encounter for immunization: Secondary | ICD-10-CM | POA: Diagnosis not present

## 2015-04-30 NOTE — Progress Notes (Signed)
      History of Present Illness:  Jacob Mason continues to complain of postprandial abdominal bloating that usually occurs about an hour after eating.  He has found that, by physical activity such as walking, bloating is significantly less.  He became constipated with Lomotil and feels that diarrhea is not a problem.  He has tried nutritional supplements such as ensure and boost but claims that this suppresses his appetite.  He is concerned because he dropped another 2 pounds or so.    Review of Systems: Pertinent positive and negative review of systems were noted in the above HPI section. All other review of systems were otherwise negative.    Current Medications, Allergies, Past Medical History, Past Surgical History, Family History and Social History were reviewed in [employer] electronic medical record  Vital signs were reviewed in today's medical record. Physical Exam: General: Thin male in no acute distress   See Assessment and Plan under Problem List

## 2015-04-30 NOTE — Patient Instructions (Signed)
Gastroparesis  Gastroparesis is also called slowed stomach emptying (delayed gastric emptying). It is a condition in which the stomach takes too long to empty its contents. It often happens in people with diabetes.  CAUSES  Gastroparesis happens when nerves to the stomach are damaged or stop working. When the nerves are damaged, the muscles of the stomach and intestines do not work normally. The movement of food is slowed or stopped. High blood glucose (sugar) causes changes in nerves and can damage the blood vessels that carry oxygen and nutrients to the nerves. RISK FACTORS  Diabetes.  Post-viral syndromes.  Eating disorders (anorexia, bulimia).  Surgery on the stomach or vagus nerve.  Gastroesophageal reflux disease (rarely).  Smooth muscle disorders (amyloidosis, scleroderma).  Metabolic disorders, including hypothyroidism.  Parkinson disease. SYMPTOMS   Heartburn.  Feeling sick to your stomach (nausea).  Vomiting of undigested food.  An early feeling of fullness when eating.  Weight loss.  Abdominal bloating.  Erratic blood glucose levels.  Lack of appetite.  Gastroesophageal reflux.  Spasms of the stomach wall. Complications can include:  Bacterial overgrowth in stomach. Food stays in the stomach and can ferment and cause bacteria to grow.  Weight loss due to difficulty digesting and absorbing nutrients.  Vomiting.  Obstruction in the stomach. Undigested food can harden and cause nausea and vomiting.  Blood glucose fluctuations caused by inconsistent food absorption. DIAGNOSIS  The diagnosis of gastroparesis is confirmed through one or more of the following tests:  Barium X-rays and scans. These tests look at how long it takes for food to move through the stomach.  Gastric manometry. This test measures electrical and muscular activity in the stomach. A thin tube is passed down the throat into the stomach. The tube contains a wire that takes measurements  of the stomach's electrical and muscular activity as it digests liquids and solid food.  Endoscopy. This procedure is done with a long, thin tube called an endoscope. It is passed through the mouth and gently down the esophagus into the stomach. This tube helps the caregiver look at the lining of the stomach to check for any abnormalities.  Ultrasonography. This can rule out gallbladder disease or pancreatitis. This test will outline and define the shape of the gallbladder and pancreas. TREATMENT   Treatments may include:  Exercise.  Medicines to control nausea and vomiting.  Medicines to stimulate stomach muscles.  Changes in what and when you eat.  Having smaller meals more often.  Eating low-fiber forms of high-fiber foods, such as eating cooked vegetables instead of raw vegetables.  Eating low-fat foods.  Consuming liquids, which are easier to digest.  In severe cases, feeding tubes and intravenous (IV) feeding may be needed. It is important to note that in most cases, treatment does not cure gastroparesis. It is usually a lasting (chronic) condition. Treatment helps you manage the underlying condition so that you can be as healthy and comfortable as possible. Other treatments  A gastric neurostimulator has been developed to assist people with gastroparesis. The battery-operated device is surgically implanted. It emits mild electrical pulses to help improve stomach emptying and to control nausea and vomiting.  The use of botulinum toxin has been shown to improve stomach emptying by decreasing the prolonged contractions of the muscle between the stomach and the small intestine (pyloric sphincter). The benefits are temporary. SEEK MEDICAL CARE IF:   You have diabetes and you are having problems keeping your blood glucose in goal range.  You are having nausea,   vomiting, bloating, or early feelings of fullness with eating.  Your symptoms do not change with a change in  diet. Document Released: 07/27/2005 Document Revised: 11/21/2012 Document Reviewed: 01/03/2009 [facility] Patient Information 2015 [place], [place]. This information is not intended to replace advice given to you by your health care provider. Make sure you discuss any questions you have with your health care provider.  We will refer you to see a nutrientist, We will contact you with that appointment

## 2015-04-30 NOTE — Assessment & Plan Note (Addendum)
Weight has slightly decreased although I doubt there is any new problem.  Bloating seems to be improved when he exercises postprandially.  Overall I think he is highly attuned to any abdominal distention and may have some secondary psychological overlay.    Recommendations #1 gastroparesis diet #2 patient was encouraged to exercise or do some physical activity after meals #3 nutrition consult

## 2015-05-03 ENCOUNTER — Telehealth: Payer: Self-pay | Admitting: Gastroenterology

## 2015-05-03 DIAGNOSIS — R634 Abnormal weight loss: Secondary | ICD-10-CM

## 2015-05-03 NOTE — Telephone Encounter (Signed)
L/M for [facility] management to contact the office once referral is completed. Referral in depot

## 2015-05-09 NOTE — Telephone Encounter (Signed)
Called [facility] about the referral. Patients insurance will not cover nutrient referral for weight loss. They will only pay if the patient is in renal failure or has cancer. It will be an out of pocket visit costing $250  Will call the patient to inform   L/M for patient to contact the office if he still wants the referral appointment

## 2015-05-13 ENCOUNTER — Other Ambulatory Visit: Payer: Self-pay | Admitting: Internal Medicine

## 2015-05-17 ENCOUNTER — Telehealth: Payer: Self-pay | Admitting: Gastroenterology

## 2015-05-17 NOTE — Telephone Encounter (Signed)
ok 

## 2015-05-17 NOTE — Telephone Encounter (Signed)
Patient called he states he received his letter in the mail about Dr. Arlyce Dice. He says he would like to see Dr. Leone Payor for his care. He doesn't need to be seen right away just wants to make the change.

## 2015-05-20 NOTE — Telephone Encounter (Signed)
Left message for pt to be aware of the change

## 2015-05-23 ENCOUNTER — Telehealth: Payer: Self-pay | Admitting: Internal Medicine

## 2015-05-23 NOTE — Telephone Encounter (Signed)
He may be offered an earlier appt with an APP or can put him on 06/06/15 in the afternoon there are some blocked spots

## 2015-05-23 NOTE — Telephone Encounter (Signed)
Appointment scheduled for 06/06/15 with Dr. Leone PayorGessner.

## 2015-06-06 ENCOUNTER — Encounter: Payer: Self-pay | Admitting: Internal Medicine

## 2015-06-06 ENCOUNTER — Ambulatory Visit (INDEPENDENT_AMBULATORY_CARE_PROVIDER_SITE_OTHER): Payer: Medicare Other | Admitting: Internal Medicine

## 2015-06-06 ENCOUNTER — Ambulatory Visit: Payer: Medicare Other | Admitting: Internal Medicine

## 2015-06-06 VITALS — BP 108/60 | HR 64 | Ht 62.5 in | Wt 98.0 lb

## 2015-06-06 DIAGNOSIS — R634 Abnormal weight loss: Secondary | ICD-10-CM

## 2015-06-06 DIAGNOSIS — K589 Irritable bowel syndrome without diarrhea: Secondary | ICD-10-CM | POA: Diagnosis not present

## 2015-06-06 DIAGNOSIS — K30 Functional dyspepsia: Secondary | ICD-10-CM

## 2015-06-06 DIAGNOSIS — K529 Noninfective gastroenteritis and colitis, unspecified: Secondary | ICD-10-CM

## 2015-06-06 MED ORDER — BUSPIRONE HCL 10 MG PO TABS: ORAL_TABLET | ORAL | Status: DC

## 2015-06-06 MED ORDER — MIRTAZAPINE 15 MG PO TABS: ORAL_TABLET | ORAL | Status: DC

## 2015-06-06 NOTE — Patient Instructions (Addendum)
   Starting buspirone for functional dyspepsia.  Rx sent to pharmacy.  Take the generic Buspar as follows:  1/2 tablet at bedtime for 1 week then 1/2 tablet twice a day then if needed may increase to one tablet twice a day.  Testing stool for pancreatic elastase.  Please go to the lab before leaving.  Stop the flax seed.  Follow up with Dr. Leone PayorGessner in 2 months.  Call back and set up appointment.  If your weight goes below 89 pounds call me please  I appreciate the opportunity to care for you. Jacob Booparl E. Emmely Bittinger, MD, Clementeen GrahamFACG

## 2015-06-06 NOTE — Progress Notes (Signed)
   Subjective:    Patient ID: Jacob Mason, male    DOB: May 02, 1929, 81 y.o.   MRN: [id number] Chief complaint: Weight loss HPI The patient is a very nice elderly [place] man with a long history of postprandial bloating and loose stools with what sounds like functional dyspepsia and IBS. Multiple treatments and been tried over the years. He's had extensive workups at [facility]. He has seen Dr. Dorna BloomWu and then Dr. Alycia RossettiKoch and has seen Dr. Jarold MottoPatterson and Dr. Arlyce DiceKaplan in my practice. Gastric emptying study is normal. He is concerned because he is losing weight, he was 99 pounds in the beginning of this year and is now 94 pounds. He describes chronic postprandial bloating and early satiety of in intermittent loose stools. He says he eats less he tends to exercise more and he thinks his caloric intake record balance is off and negative. He has been on mirtazapine or Remeron for many years and has cut the dose of that over the last couple of months. He is probably taking about 3-1/2 mg a day. He says he never thought that made a difference for him and that he reduce the dose after the weight started coming off. He did this at the direction of his son who is a family Radio broadcast assistantpractitioner in [place]. Medications, allergies, past medical history, past surgical history, family history and social history are reviewed and updated in the EMR.  Review of Systems As above    Objective:   Physical Exam @BP  108/60 mmHg  Pulse 64  Ht 5' 2.5" (1.588 m)  Wt 98 lb (44.453 kg)  BMI 17.63 kg/m2@  General:  NAD Eyes:   anicteric Lungs:  clear Heart:: S1S2 no rubs, murmurs or gallops Abdomen:  soft and nontender, BS+  Data Reviewed:  I reviewed records [facility] and [facility] as above.     Assessment & Plan:   1. Functional dyspepsia   2. IBS (irritable bowel syndrome)   3. Chronic diarrhea   4. Loss of weight    He certainly seems as some  sort of functional gastrointestinal disorder. However can be hard to tease out when something else is occurring in the setting of that. He has lost some weight. If this continues I would probably do an EGD at been a number of years since she's had one though he has had at least 2 in his lifetime he says. Overall his symptoms are chronic and similar is just that he thinks his weight is down more than has been in the past.  Diagnostic testing today will include pancreatic fecal elastase. He says he is tried over-the-counter digestive enzymes supplements and has not helped.  I will try buspirone 5 mg nightly and then twice a day in that up to 10 mg twice a day for what I think is functional dyspepsia. Side effects reviewed.  I will see him in 2 months, I told his weight was below 89 pounds to call back.\  25 minutes time spent with patient over at which is in counseling and coordination of care.

## 2015-06-10 ENCOUNTER — Other Ambulatory Visit: Payer: Medicare Other

## 2015-06-10 ENCOUNTER — Telehealth: Payer: Self-pay | Admitting: Internal Medicine

## 2015-06-10 DIAGNOSIS — K529 Noninfective gastroenteritis and colitis, unspecified: Secondary | ICD-10-CM

## 2015-06-10 NOTE — Telephone Encounter (Signed)
Patient said he had good effect from his Buspirone Thur, Friday, Sat but not Sun.  He wants to know if he may increase before Thurs of this week  when he would be increasing dose.  Please advise, thank you Sir.

## 2015-06-11 NOTE — Telephone Encounter (Signed)
Spoke with patient and informed him of the medicine instructions, he verbalized understanding.

## 2015-06-11 NOTE — Telephone Encounter (Signed)
The plan is for 5 mg nightly for about a week and 5 mg twice a day, then after one more week go to 10 mg twice a day unless he feels very good at 5 mg twice a day

## 2015-06-19 LAB — PANCREATIC ELASTASE, FECAL: PANCREATIC ELASTASE-1, STL: 493 ug/g

## 2015-06-20 NOTE — Progress Notes (Signed)
Quick Note:  Pancreas seems to be working ok How is he doing at this time and is he taking pancreatic enzymes still? ______

## 2015-06-21 NOTE — Progress Notes (Signed)
Quick Note:  OK - right on the buspirone  Hopefully overall improved Continue the plan with buspirone and see me in jan/feb ______

## 2015-07-09 ENCOUNTER — Telehealth: Payer: Self-pay | Admitting: Internal Medicine

## 2015-07-09 NOTE — Telephone Encounter (Signed)
Patient notified that he was to come back to see Dr. Leone PayorGessner about his weight loss and response to Buspar.  He is scheduled for follow up for 08/12/14 3:30

## 2015-07-29 ENCOUNTER — Ambulatory Visit: Payer: Medicare Other | Admitting: Internal Medicine

## 2015-08-13 ENCOUNTER — Encounter: Payer: Self-pay | Admitting: Internal Medicine

## 2015-08-13 ENCOUNTER — Ambulatory Visit (INDEPENDENT_AMBULATORY_CARE_PROVIDER_SITE_OTHER): Payer: Medicare Other | Admitting: Internal Medicine

## 2015-08-13 VITALS — BP 108/56 | HR 68 | Ht 62.5 in | Wt 97.4 lb

## 2015-08-13 DIAGNOSIS — K30 Functional dyspepsia: Secondary | ICD-10-CM

## 2015-08-13 DIAGNOSIS — K589 Irritable bowel syndrome without diarrhea: Secondary | ICD-10-CM

## 2015-08-13 MED ORDER — BUSPIRONE HCL 10 MG PO TABS: 10.0000 mg | ORAL_TABLET | Freq: Two times a day (BID) | ORAL | Status: DC

## 2015-08-13 NOTE — Progress Notes (Signed)
   Subjective:    Patient ID: Jacob Mason, male    DOB: October 30, 1928, 82 y.o.   MRN: [id number] Chief complaint: Weight loss and early satiety HPI Patient returns after being seen 2 months ago, he did take buspirone for a month, he is not sure if it helped. Still seems have intermittent early satiety some good days some bad days. Remains very concerned about his weight loss. I have reviewed the fact that it's been stable since September. Wt Readings from Last 3 Encounters:  08/13/15 97 lb 6 oz (44.169 kg)  06/06/15 98 lb (44.453 kg)  04/30/15 97 lb (43.999 kg)   93-92 at home Still concerned  Also tried Nexium x 14 d OTC - not sure if it helped Tried a digestive enyme supplement also 1/2 tab Mirtazipine - 7.5 mg - so not so sleepy  Medications, allergies, past medical history, past surgical history, family history and social history are reviewed and updated in the EMR.  Review of Systems As above    Objective:   Physical Exam BP 108/56 mmHg  Pulse 68  Ht 5' 2.5" (1.588 m)  Wt 97 lb 6 oz (44.169 kg)  BMI 17.52 kg/m2     Assessment & Plan:   1. Functional dyspepsia   2. IBS (irritable bowel syndrome)    We went over things today. He asked if an upper endoscopy would be useful. I went a [place] and reviewed [facility], it's been a long time since she's had an upper endoscopy but it's clear he's had chronic recurrent dyspepsia and early satiety. His weight is stable at this point. He is not having diarrhea like he has had in the past. Our current plan is for him to retry the buspirone and take it for at least 2 months at 10 mg twice a day, he wants to take another two-week course of over-the-counter Nexium which is unlikely to hurt anything and so he can try that. He will return to see me in 2 months  15 minutes time spent with patient > half in counseling coordination of care

## 2015-08-13 NOTE — Patient Instructions (Signed)
We have sent the following medications to your pharmacy for you to pick up at your convenience: Buspar  Please follow up with Dr. Leone PayorGessner on 10/10/2015 at 11:30am

## 2015-09-04 ENCOUNTER — Telehealth: Payer: Self-pay

## 2015-09-04 MED ORDER — MIRTAZAPINE 15 MG PO TABS: ORAL_TABLET | ORAL | Status: DC

## 2015-09-04 NOTE — Telephone Encounter (Signed)
Requested refill request for mirtazapin 15 mg, please advise Sir.

## 2015-09-04 NOTE — Telephone Encounter (Signed)
Dr Leone Payor changed refills to # 3 not 6.

## 2015-09-04 NOTE — Telephone Encounter (Signed)
Refill x6

## 2015-09-12 ENCOUNTER — Telehealth: Payer: Self-pay | Admitting: Internal Medicine

## 2015-09-12 NOTE — Telephone Encounter (Signed)
OK to refill - I thought we just did this - you may want to call and clarify

## 2015-09-12 NOTE — Telephone Encounter (Signed)
Please advise Sir, thank you. 

## 2015-09-12 NOTE — Telephone Encounter (Signed)
Spoke with the [facility] order where rx was sent in on 09/04/15 the pharmacist said it's too early to fill. Not due until 10/08/15.  I spoke with Jacob Mason and he has enough till then, he was calling early.  I told him that the [facility] mail order said for him just to call when closer to that date and they will send out.

## 2015-10-04 DIAGNOSIS — H524 Presbyopia: Secondary | ICD-10-CM | POA: Diagnosis not present

## 2015-10-10 ENCOUNTER — Ambulatory Visit (INDEPENDENT_AMBULATORY_CARE_PROVIDER_SITE_OTHER): Payer: Medicare Other | Admitting: Internal Medicine

## 2015-10-10 ENCOUNTER — Encounter: Payer: Self-pay | Admitting: Internal Medicine

## 2015-10-10 VITALS — BP 116/62 | HR 64 | Ht 62.5 in | Wt 97.5 lb

## 2015-10-10 DIAGNOSIS — R634 Abnormal weight loss: Secondary | ICD-10-CM

## 2015-10-10 DIAGNOSIS — K589 Irritable bowel syndrome without diarrhea: Secondary | ICD-10-CM

## 2015-10-10 NOTE — Assessment & Plan Note (Signed)
Stabilized.  

## 2015-10-10 NOTE — Patient Instructions (Signed)
   Glad to hear things have been better. Please call back as needed.  I appreciate the opportunity to care for you. Iva Boop, MD, Clementeen Graham

## 2015-10-10 NOTE — Progress Notes (Signed)
   Subjective:    Patient ID: Jacob Mason, male    DOB: 1928/12/23, 82 y.o.   MRN: [id number] Chief complaint: Follow-up weight loss HPI Jacob Mason is here his weight has been stable. He says February has been better than January. He was not sure that buspirone helped him so he stopped that. He continues to take mirtazapine at bedtime though he is now up to a full tablet of 15 mg. It helps him sleep. He believes that exercising by walking 20 or 30 minutes more than once a day and getting outside is helped his stomach feel better. Wt Readings from Last 3 Encounters:  10/10/15 97 lb 8 oz (44.226 kg)  08/13/15 97 lb 6 oz (44.169 kg)  06/06/15 98 lb (44.453 kg)   Medications, allergies, past medical history, past surgical history, family history and social history are reviewed and updated in the EMR.   Review of Systems As above    Objective:   Physical Exam BP 116/62 mmHg  Pulse 64  Ht 5' 2.5" (1.588 m)  Wt 97 lb 8 oz (44.226 kg)  BMI 17.54 kg/m2 Elderly small statured man in no acute distress    Assessment & Plan:  Loss of weight Stabilized  Irritable bowel Improved  He will see me as needed at this point.

## 2015-10-10 NOTE — Assessment & Plan Note (Signed)
Improved

## 2015-10-15 ENCOUNTER — Telehealth: Payer: Self-pay | Admitting: Internal Medicine

## 2015-10-15 NOTE — Telephone Encounter (Signed)
Please elucidate the dosage Sir, thank you.

## 2015-10-16 MED ORDER — MIRTAZAPINE 15 MG PO TABS: 15.0000 mg | ORAL_TABLET | Freq: Every day | ORAL | Status: DC

## 2015-10-16 NOTE — Telephone Encounter (Signed)
Spoke with Mr Nedra HaiLee and confirmed the pharmacy to send his rx of remeron too.  Dose corrected.

## 2015-10-16 NOTE — Telephone Encounter (Signed)
It is supposed to be 15 mg nightly. I may have not taken out the 1/4 tablet thing that he was doing before He can have # [phone] RF

## 2015-10-17 DIAGNOSIS — H18413 Arcus senilis, bilateral: Secondary | ICD-10-CM | POA: Diagnosis not present

## 2015-10-17 DIAGNOSIS — H1820 Unspecified corneal edema: Secondary | ICD-10-CM | POA: Diagnosis not present

## 2015-10-17 DIAGNOSIS — Z961 Presence of intraocular lens: Secondary | ICD-10-CM | POA: Diagnosis not present

## 2015-11-04 DIAGNOSIS — H16143 Punctate keratitis, bilateral: Secondary | ICD-10-CM | POA: Diagnosis not present

## 2015-11-04 DIAGNOSIS — H18413 Arcus senilis, bilateral: Secondary | ICD-10-CM | POA: Diagnosis not present

## 2015-11-04 DIAGNOSIS — Z961 Presence of intraocular lens: Secondary | ICD-10-CM | POA: Diagnosis not present

## 2015-11-04 DIAGNOSIS — H1820 Unspecified corneal edema: Secondary | ICD-10-CM | POA: Diagnosis not present

## 2015-11-20 DIAGNOSIS — H16143 Punctate keratitis, bilateral: Secondary | ICD-10-CM | POA: Diagnosis not present

## 2015-11-20 DIAGNOSIS — H18413 Arcus senilis, bilateral: Secondary | ICD-10-CM | POA: Diagnosis not present

## 2015-11-20 DIAGNOSIS — H1820 Unspecified corneal edema: Secondary | ICD-10-CM | POA: Diagnosis not present

## 2015-11-20 DIAGNOSIS — H16223 Keratoconjunctivitis sicca, not specified as Sjogren's, bilateral: Secondary | ICD-10-CM | POA: Diagnosis not present

## 2015-12-24 ENCOUNTER — Other Ambulatory Visit (INDEPENDENT_AMBULATORY_CARE_PROVIDER_SITE_OTHER): Payer: Medicare Other

## 2015-12-24 DIAGNOSIS — Z0000 Encounter for general adult medical examination without abnormal findings: Secondary | ICD-10-CM

## 2015-12-24 LAB — CBC WITH DIFFERENTIAL/PLATELET
BASOS PCT: 0.3 % (ref 0.0–3.0)
Basophils Absolute: 0 10*3/uL (ref 0.0–0.1)
EOS PCT: 1.1 % (ref 0.0–5.0)
Eosinophils Absolute: 0.1 10*3/uL (ref 0.0–0.7)
HEMATOCRIT: 40.2 % (ref 39.0–52.0)
Hemoglobin: 13.5 g/dL (ref 13.0–17.0)
Lymphocytes Relative: 27.6 % (ref 12.0–46.0)
Lymphs Abs: 1.3 10*3/uL (ref 0.7–4.0)
MCHC: 33.6 g/dL (ref 30.0–36.0)
MCV: 94.4 fl (ref 78.0–100.0)
MONOS PCT: 7.5 % (ref 3.0–12.0)
Monocytes Absolute: 0.4 10*3/uL (ref 0.1–1.0)
Neutro Abs: 3 10*3/uL (ref 1.4–7.7)
Neutrophils Relative %: 63.5 % (ref 43.0–77.0)
Platelets: 227 10*3/uL (ref 150.0–400.0)
RBC: 4.26 Mil/uL (ref 4.22–5.81)
RDW: 14.4 % (ref 11.5–15.5)
WBC: 4.8 10*3/uL (ref 4.0–10.5)

## 2015-12-24 LAB — BASIC METABOLIC PANEL
BUN: 19 mg/dL (ref 6–23)
CALCIUM: 9.5 mg/dL (ref 8.4–10.5)
CO2: 30 mEq/L (ref 19–32)
Chloride: 104 mEq/L (ref 96–112)
Creatinine, Ser: 0.74 mg/dL (ref 0.40–1.50)
GFR: 106.89 mL/min (ref >60.00)
GLUCOSE: 103 mg/dL — AB (ref 70–99)
Potassium: 4.3 mEq/L (ref 3.5–5.1)
Sodium: 140 mEq/L (ref 135–145)

## 2015-12-24 LAB — LIPID PANEL
CHOL/HDL RATIO: 3
CHOLESTEROL: 209 mg/dL — AB (ref 0–200)
HDL: 71.9 mg/dL (ref >39.00)
LDL CALC: 127 mg/dL — AB (ref 0–99)
NonHDL: 136.79
TRIGLYCERIDES: 47 mg/dL (ref 0.0–149.0)
VLDL: 9.4 mg/dL (ref 0.0–40.0)

## 2015-12-24 LAB — URINALYSIS, ROUTINE W REFLEX MICROSCOPIC
Bilirubin Urine: NEGATIVE
HGB URINE DIPSTICK: NEGATIVE
Ketones, ur: NEGATIVE
LEUKOCYTES UA: NEGATIVE
NITRITE: NEGATIVE
Specific Gravity, Urine: 1.015 (ref 1.000–1.030)
Total Protein, Urine: NEGATIVE
URINE GLUCOSE: NEGATIVE
Urobilinogen, UA: 0.2 (ref 0.0–1.0)
pH: 6 (ref 5.0–8.0)

## 2015-12-24 LAB — HEPATIC FUNCTION PANEL
ALBUMIN: 4.2 g/dL (ref 3.5–5.2)
ALT: 13 U/L (ref 0–53)
AST: 20 U/L (ref 0–37)
Alkaline Phosphatase: 57 U/L (ref 39–117)
BILIRUBIN TOTAL: 0.8 mg/dL (ref 0.2–1.2)
Bilirubin, Direct: 0.1 mg/dL (ref 0.0–0.3)
Total Protein: 6.9 g/dL (ref 6.0–8.3)

## 2015-12-24 LAB — TSH: TSH: 0.48 u[IU]/mL (ref 0.35–4.50)

## 2015-12-31 ENCOUNTER — Ambulatory Visit (INDEPENDENT_AMBULATORY_CARE_PROVIDER_SITE_OTHER): Payer: Medicare Other | Admitting: Internal Medicine

## 2015-12-31 ENCOUNTER — Encounter: Payer: Self-pay | Admitting: Internal Medicine

## 2015-12-31 VITALS — BP 122/78 | HR 60 | Temp 98.1°F | Resp 20 | Wt 100.0 lb

## 2015-12-31 DIAGNOSIS — R7302 Impaired glucose tolerance (oral): Secondary | ICD-10-CM

## 2015-12-31 DIAGNOSIS — E785 Hyperlipidemia, unspecified: Secondary | ICD-10-CM | POA: Diagnosis not present

## 2015-12-31 DIAGNOSIS — Z0001 Encounter for general adult medical examination with abnormal findings: Secondary | ICD-10-CM

## 2015-12-31 DIAGNOSIS — F329 Major depressive disorder, single episode, unspecified: Secondary | ICD-10-CM

## 2015-12-31 DIAGNOSIS — R64 Cachexia: Secondary | ICD-10-CM | POA: Diagnosis not present

## 2015-12-31 DIAGNOSIS — R6889 Other general symptoms and signs: Secondary | ICD-10-CM | POA: Diagnosis not present

## 2015-12-31 DIAGNOSIS — F32A Depression, unspecified: Secondary | ICD-10-CM

## 2015-12-31 HISTORY — DX: Hyperlipidemia, unspecified: E78.5

## 2015-12-31 NOTE — Patient Instructions (Signed)

## 2015-12-31 NOTE — Progress Notes (Signed)
Pre visit review using our clinic review tool, if applicable. No additional management support is needed unless otherwise documented below in the visit note. 

## 2015-12-31 NOTE — Progress Notes (Signed)
Subjective:    Patient ID: Jacob Mason, male    DOB: January 30, 1929, 82 y.o.   MRN: [id number]  HPI  Here for wellness and f/u;  Overall doing ok;  Pt denies Chest pain, worsening SOB, DOE, wheezing, orthopnea, PND, worsening LE edema, palpitations, dizziness or syncope.  Pt denies neurological change such as new headache, facial or extremity weakness.  Pt denies polydipsia, polyuria, or low sugar symptoms. Pt states overall good compliance with treatment and medications, good tolerability, and has been trying to follow appropriate diet.  Pt has had worsening depressive symptoms,but no suicidal ideation or panic. No fever, night sweats, wt loss, loss of appetite, or other constitutional symptoms.  Pt states good ability with ADL's, has low fall risk, home safety reviewed and adequate, no other significant changes in hearing or vision, and only occasionally active with exercise.  Hard to gain wt, just doesn't feel like eating most of the time.  Pt denies polydipsia, polyuria, or low sugar symptoms such as weakness or confusion improved with po intake.  Pt states overall good compliance with meds    Wt Readings from Last 3 Encounters:  12/31/15 100 lb (45.36 kg)  10/10/15 97 lb 8 oz (44.226 kg)  08/13/15 97 lb 6 oz (44.169 kg)   Past Medical History  Diagnosis Date  . Diverticulitis   . BPH (benign prostatic hypertrophy)   . Bilateral hearing loss 02/23/2013  . Anxiety state, unspecified 02/23/2013  . Irritable bowel 03/14/2013  . Insomnia 03/14/2013  . Depression 03/14/2013  . Impaired glucose tolerance 03/14/2013  . Cachexia (HCC) 03/14/2013  . Hyperlipidemia 12/31/2015   Past Surgical History  Procedure Laterality Date  . Prostate surgery    . Shoulder surgery      reports that he has quit smoking. He has never used smokeless tobacco. He reports that he drinks alcohol. He reports that he does not use illicit drugs. family history is not on file. No Known Allergies Current Outpatient Prescriptions on  File Prior to Visit  Medication Sig Dispense Refill  . Cholecalciferol (VITAMIN D) 2000 UNITS CAPS Take 1 capsule by mouth daily.    . Lutein 20 MG TABS Take by mouth daily.    . mirtazapine (REMERON) 15 MG tablet Take 1 tablet (15 mg total) by mouth at bedtime. 30 tablet 11  . Multiple Vitamin (MULTIVITAMIN) tablet Take 1 tablet by mouth daily.     No current facility-administered medications on file prior to visit.   Review of Systems Constitutional: Negative for increased diaphoresis, or other activity, appetite or siginficant weight change other than noted HENT: Negative for worsening hearing loss, ear pain, facial swelling, mouth sores and neck stiffness.   Eyes: Negative for other worsening pain, redness or visual disturbance.  Respiratory: Negative for choking or stridor Cardiovascular: Negative for other chest pain and palpitations.  Gastrointestinal: Negative for worsening diarrhea, blood in stool, or abdominal distention Genitourinary: Negative for hematuria, flank pain or change in urine volume.  Musculoskeletal: Negative for myalgias or other joint complaints.  Skin: Negative for other color change and wound or drainage.  Neurological: Negative for syncope and numbness. other than noted Hematological: Negative for adenopathy. or other swelling Psychiatric/Behavioral: Negative for hallucinations, SI, self-injury, decreased concentration or other worsening agitation.      Objective:   Physical Exam BP 122/78 mmHg  Pulse 60  Temp(Src) 98.1 F (36.7 C) (Oral)  Resp 20  Wt 100 lb (45.36 kg)  SpO2 97% VS noted,  Constitutional: Pt  is oriented to person, place, and time. Appears thin, cachectic, in no significant distress Head: Normocephalic and atraumatic  Eyes: Conjunctivae and EOM are normal. Pupils are equal, round, and reactive to light Right Ear: External ear normal.  Left Ear: External ear normal Nose: Nose normal.  Mouth/Throat: Oropharynx is clear and moist    Neck: Normal range of motion. Neck supple. No JVD present. No tracheal deviation present or significant neck LA or mass Cardiovascular: Normal rate, regular rhythm, normal heart sounds and intact distal pulses.   Pulmonary/Chest: Effort normal and breath sounds without rales or wheezing  Abdominal: Soft. Bowel sounds are normal. NT. No HSM  Musculoskeletal: Normal range of motion. Exhibits no edema Lymphadenopathy: Has no cervical adenopathy.  Neurological: Pt is alert and oriented to person, place, and time. Pt has normal reflexes. No cranial nerve deficit. Motor grossly intact Skin: Skin is warm and dry. No rash noted or new ulcers Psychiatric:   Behavior is normal.   Depressed affect    Assessment & Plan:

## 2016-01-06 NOTE — Assessment & Plan Note (Addendum)
stable overall by history and exam, and pt to continue medical treatment as before,  to f/u any worsening symptoms or concerns, declines change in med or referral for counseling or psychiatric care  In addition to the time spent performing CPE, I spent an additional 40 minutes face to face,in which greater than 50% of this time was spent in counseling and coordination of care for patient's acute illness as documented.

## 2016-01-06 NOTE — Assessment & Plan Note (Signed)
stable overall by history and exam, recent data reviewed with pt, and pt to continue medical treatment as before,  to f/u any worsening symptoms or concerns Lab Results  Component Value Date   WBC 4.8 12/24/2015   HGB 13.5 12/24/2015   HCT 40.2 12/24/2015   PLT 227.0 12/24/2015   GLUCOSE 103* 12/24/2015   CHOL 209* 12/24/2015   TRIG 47.0 12/24/2015   HDL 71.90 12/24/2015   LDLCALC 127* 12/24/2015   ALT 13 12/24/2015   AST 20 12/24/2015   NA 140 12/24/2015   K 4.3 12/24/2015   CL 104 12/24/2015   CREATININE 0.74 12/24/2015   BUN 19 12/24/2015   CO2 30 12/24/2015   TSH 0.48 12/24/2015   PSA 0.72 02/23/2013

## 2016-01-06 NOTE — Assessment & Plan Note (Signed)
Lab Results  Component Value Date   LDLCALC 127* 12/24/2015   Mild elev, for lower chol diet, declines statin for now,  to f/u any worsening symptoms or concerns

## 2016-01-06 NOTE — Assessment & Plan Note (Signed)
Suspect most likely related to psychiatric, chronic persistent, for ensure tid,  to f/u any worsening symptoms or concerns

## 2016-01-06 NOTE — Assessment & Plan Note (Signed)

## 2016-04-16 ENCOUNTER — Ambulatory Visit (INDEPENDENT_AMBULATORY_CARE_PROVIDER_SITE_OTHER): Payer: Medicare Other

## 2016-04-16 DIAGNOSIS — Z23 Encounter for immunization: Secondary | ICD-10-CM | POA: Diagnosis not present

## 2016-05-20 DIAGNOSIS — Z961 Presence of intraocular lens: Secondary | ICD-10-CM | POA: Diagnosis not present

## 2016-05-20 DIAGNOSIS — H16143 Punctate keratitis, bilateral: Secondary | ICD-10-CM | POA: Diagnosis not present

## 2016-05-20 DIAGNOSIS — H18413 Arcus senilis, bilateral: Secondary | ICD-10-CM | POA: Diagnosis not present

## 2016-05-20 DIAGNOSIS — H16223 Keratoconjunctivitis sicca, not specified as Sjogren's, bilateral: Secondary | ICD-10-CM | POA: Diagnosis not present

## 2016-06-25 ENCOUNTER — Other Ambulatory Visit: Payer: Self-pay

## 2016-06-25 MED ORDER — MIRTAZAPINE 15 MG PO TABS
15.0000 mg | ORAL_TABLET | Freq: Every day | ORAL | 3 refills | Status: DC
Start: 2016-06-25 — End: 2017-12-13

## 2016-06-25 NOTE — Telephone Encounter (Signed)
Mr Jacob Mason walked in with a prescription renewal form for his mirtazapine 15mg  tablets.  Dr Leone PayorGessner filled it out and I faxed it to [facility] at fax # [phone].

## 2016-10-07 DIAGNOSIS — H524 Presbyopia: Secondary | ICD-10-CM | POA: Diagnosis not present

## 2017-01-19 ENCOUNTER — Encounter: Payer: Medicare Other | Admitting: Internal Medicine

## 2017-01-19 ENCOUNTER — Other Ambulatory Visit (INDEPENDENT_AMBULATORY_CARE_PROVIDER_SITE_OTHER): Payer: Medicare Other

## 2017-01-19 DIAGNOSIS — Z0001 Encounter for general adult medical examination with abnormal findings: Secondary | ICD-10-CM | POA: Diagnosis not present

## 2017-01-20 ENCOUNTER — Ambulatory Visit (INDEPENDENT_AMBULATORY_CARE_PROVIDER_SITE_OTHER): Payer: Medicare Other | Admitting: Internal Medicine

## 2017-01-20 ENCOUNTER — Encounter: Payer: Self-pay | Admitting: Internal Medicine

## 2017-01-20 ENCOUNTER — Other Ambulatory Visit (INDEPENDENT_AMBULATORY_CARE_PROVIDER_SITE_OTHER): Payer: Medicare Other

## 2017-01-20 ENCOUNTER — Ambulatory Visit (INDEPENDENT_AMBULATORY_CARE_PROVIDER_SITE_OTHER)
Admission: RE | Admit: 2017-01-20 | Discharge: 2017-01-20 | Disposition: A | Discharge status: Home / Self Care | Payer: Medicare Other | Source: Ambulatory Visit | Attending: Internal Medicine | Admitting: Internal Medicine

## 2017-01-20 VITALS — BP 116/72 | HR 61 | Ht 63.0 in | Wt 95.0 lb

## 2017-01-20 DIAGNOSIS — R634 Abnormal weight loss: Secondary | ICD-10-CM

## 2017-01-20 DIAGNOSIS — R7302 Impaired glucose tolerance (oral): Secondary | ICD-10-CM

## 2017-01-20 DIAGNOSIS — Z0001 Encounter for general adult medical examination with abnormal findings: Secondary | ICD-10-CM | POA: Diagnosis not present

## 2017-01-20 DIAGNOSIS — Z0000 Encounter for general adult medical examination without abnormal findings: Secondary | ICD-10-CM | POA: Diagnosis not present

## 2017-01-20 DIAGNOSIS — J439 Emphysema, unspecified: Secondary | ICD-10-CM | POA: Diagnosis not present

## 2017-01-20 LAB — HEPATIC FUNCTION PANEL
ALBUMIN: 4.4 g/dL (ref 3.5–5.2)
ALT: 13 U/L (ref 0–53)
AST: 21 U/L (ref 0–37)
Alkaline Phosphatase: 51 U/L (ref 39–117)
Bilirubin, Direct: 0.2 mg/dL (ref 0.0–0.3)
TOTAL PROTEIN: 7.2 g/dL (ref 6.0–8.3)
Total Bilirubin: 1 mg/dL (ref 0.2–1.2)

## 2017-01-20 LAB — CBC WITH DIFFERENTIAL/PLATELET
BASOS ABS: 0 10*3/uL (ref 0.0–0.1)
Basophils Relative: 0.3 % (ref 0.0–3.0)
EOS PCT: 0.6 % (ref 0.0–5.0)
Eosinophils Absolute: 0 10*3/uL (ref 0.0–0.7)
HCT: 40.1 % (ref 39.0–52.0)
HEMOGLOBIN: 13.7 g/dL (ref 13.0–17.0)
Lymphocytes Relative: 23.9 % (ref 12.0–46.0)
Lymphs Abs: 1 10*3/uL (ref 0.7–4.0)
MCHC: 34.2 g/dL (ref 30.0–36.0)
MCV: 96.4 fl (ref 78.0–100.0)
Monocytes Absolute: 0.4 10*3/uL (ref 0.1–1.0)
Monocytes Relative: 9.3 % (ref 3.0–12.0)
Neutro Abs: 2.7 10*3/uL (ref 1.4–7.7)
Neutrophils Relative %: 65.9 % (ref 43.0–77.0)
Platelets: 194 10*3/uL (ref 150.0–400.0)
RBC: 4.16 Mil/uL — AB (ref 4.22–5.81)
RDW: 14 % (ref 11.5–15.5)
WBC: 4 10*3/uL (ref 4.0–10.5)

## 2017-01-20 LAB — URINALYSIS, ROUTINE W REFLEX MICROSCOPIC
Bilirubin Urine: NEGATIVE
Hgb urine dipstick: NEGATIVE
KETONES UR: NEGATIVE
Leukocytes, UA: NEGATIVE
NITRITE: NEGATIVE
RBC / HPF: NONE SEEN (ref 0–?)
SPECIFIC GRAVITY, URINE: 1.02 (ref 1.000–1.030)
Urine Glucose: NEGATIVE
Urobilinogen, UA: 0.2 (ref 0.0–1.0)
pH: 6 (ref 5.0–8.0)

## 2017-01-20 LAB — HEMOGLOBIN A1C: HEMOGLOBIN A1C: 6.5 % (ref 4.6–6.5)

## 2017-01-20 LAB — BASIC METABOLIC PANEL
BUN: 15 mg/dL (ref 6–23)
CALCIUM: 9.7 mg/dL (ref 8.4–10.5)
CO2: 32 mEq/L (ref 19–32)
Chloride: 101 mEq/L (ref 96–112)
Creatinine, Ser: 0.8 mg/dL (ref 0.40–1.50)
GFR: 97.44 mL/min (ref >60.00)
GLUCOSE: 109 mg/dL — AB (ref 70–99)
POTASSIUM: 4.2 meq/L (ref 3.5–5.1)
Sodium: 138 mEq/L (ref 135–145)

## 2017-01-20 LAB — LIPID PANEL
CHOLESTEROL: 210 mg/dL — AB (ref 0–200)
HDL: 87.6 mg/dL (ref >39.00)
LDL Cholesterol: 112 mg/dL — ABNORMAL HIGH (ref 0–99)
NonHDL: 122.68
TRIGLYCERIDES: 55 mg/dL (ref 0.0–149.0)
Total CHOL/HDL Ratio: 2
VLDL: 11 mg/dL (ref 0.0–40.0)

## 2017-01-20 LAB — TSH: TSH: 0.45 u[IU]/mL (ref 0.35–4.50)

## 2017-01-20 NOTE — Patient Instructions (Addendum)
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  Please take all new medication as prescribed - the gel for pain as needed  Please continue all other medications as before, and refills have been done if requested.  Please have the pharmacy call with any other refills you may need.  Please continue your efforts at being more active, low cholesterol diet, and weight control.  You are otherwise up to date with prevention measures today.  Please keep your appointments with your specialists as you may have planned  Your blood tests were drawn this morning  Please go to the XRAY Department in the Basement (go straight as you get off the elevator) for the x-ray testing  You will be contacted by phone if any changes need to be made immediately.  Otherwise, you will receive a letter about your results with an explanation, but please check with MyChart first.  Please remember to sign up for MyChart if you have not done so, as this will be important to you in the future with finding out test results, communicating by private email, and scheduling acute appointments online when needed.  Please return in 6 months, or sooner if needed

## 2017-01-20 NOTE — Assessment & Plan Note (Signed)
Exam benign, to cont ensure supplements, for cxr as he is former smoker as well

## 2017-01-20 NOTE — Progress Notes (Signed)
Subjective:    Patient ID: Jacob Mason, male    DOB: 08-Sep-1928, 83 y.o.   MRN: [id number]  HPI    Here for wellness and f/u;  Overall doing ok;  Pt denies Chest pain, worsening SOB, DOE, wheezing, orthopnea, PND, worsening LE edema, palpitations, dizziness or syncope.  Pt denies neurological change such as new headache, facial or extremity weakness.  Pt denies polydipsia, polyuria, or low sugar symptoms. Pt states overall good compliance with treatment and medications, good tolerability, and has been trying to follow appropriate diet.  Pt denies worsening depressive symptoms, suicidal ideation or panic. No fever, night sweats, loss of appetite, or other constitutional symptoms.  Pt states good ability with ADL's, has low fall risk, home safety reviewed and adequate, no other significant changes in hearing or vision, and occasionally active with exercise. Still laments his IBS symptoms, overall stable for 40 yrs, has been seen per GI without other findings.  Has had some worsening wt loss. Had labs drawn  Wt Readings from Last 3 Encounters:  01/20/17 95 lb (43.1 kg)  12/31/15 100 lb (45.4 kg)  10/10/15 97 lb 8 oz (44.2 kg)  Deos c/o mild right thumb [facility], asking about treatment.  Past Medical History:  Diagnosis Date  . Anxiety state, unspecified 02/23/2013  . Bilateral hearing loss 02/23/2013  . BPH (benign prostatic hypertrophy)   . Cachexia (HCC) 03/14/2013  . Depression 03/14/2013  . Diverticulitis   . Hyperlipidemia 12/31/2015  . Impaired glucose tolerance 03/14/2013  . Insomnia 03/14/2013  . Irritable bowel 03/14/2013   Past Surgical History:  Procedure Laterality Date  . PROSTATE SURGERY    . SHOULDER SURGERY      reports that he has quit smoking. He has never used smokeless tobacco. He reports that he drinks alcohol. He reports that he does not use drugs. family history is not on file. No Known Allergies Current Outpatient Prescriptions on File Prior to Visit  Medication Sig Dispense  Refill  . Cholecalciferol (VITAMIN D) 2000 UNITS CAPS Take 1 capsule by mouth daily.    . Lutein 20 MG TABS Take by mouth daily.    . mirtazapine (REMERON) 15 MG tablet Take 1 tablet (15 mg total) by mouth at bedtime. 90 tablet 3  . Multiple Vitamin (MULTIVITAMIN) tablet Take 1 tablet by mouth daily.     No current facility-administered medications on file prior to visit.    Review of Systems Constitutional: Negative for other unusual diaphoresis, sweats, appetite or weight changes HENT: Negative for other worsening hearing loss, ear pain, facial swelling, mouth sores or neck stiffness.   Eyes: Negative for other worsening pain, redness or other visual disturbance.  Respiratory: Negative for other stridor or swelling Cardiovascular: Negative for other palpitations or other chest pain  Gastrointestinal: Negative for blood in stool, distention or other pain Genitourinary: Negative for hematuria, flank pain or other change in urine volume.  Musculoskeletal: Negative for myalgias or other joint swelling.  Skin: Negative for other color change, or other wound or worsening drainage.  Neurological: Negative for other syncope or numbness. Hematological: Negative for other adenopathy or swelling Psychiatric/Behavioral: Negative for hallucinations, other worsening agitation, SI, self-injury, or new decreased concentration All other system neg per pt    Objective:   Physical Exam BP 116/72   Pulse 61   Ht 5\' 3"  (1.6 m)   Wt 95 lb (43.1 kg)   SpO2 98%   BMI 16.83 kg/m  VS noted, cachectic Constitutional: Pt is  oriented to person, place, and time. Appears well-developed and well-nourished, in no significant distress and comfortable Head: Normocephalic and atraumatic  Eyes: Conjunctivae and EOM are normal. Pupils are equal, round, and reactive to light Right Ear: External ear normal without discharge Left Ear: External ear normal without discharge Nose: Nose without discharge or  deformity Mouth/Throat: Oropharynx is without other ulcerations and moist  Neck: Normal range of motion. Neck supple. No JVD present. No tracheal deviation present or significant neck LA or mass Cardiovascular: Normal rate, regular rhythm, normal heart sounds and intact distal pulses.   Pulmonary/Chest: WOB normal and breath sounds without rales or wheezing  Abdominal: Soft. Bowel sounds are normal. NT. No HSM  Musculoskeletal: Normal range of motion. Exhibits no edema Lymphadenopathy: Has no other cervical adenopathy.  Neurological: Pt is alert and oriented to person, place, and time. Pt has normal reflexes. No cranial nerve deficit. Motor grossly intact, Gait intact Skin: Skin is warm and dry. No rash noted or new ulcerations Psychiatric:  Has normal mood and affect. Behavior is normal without agitation No other exam findings  Lab Results  Component Value Date   WBC 4.8 12/24/2015   HGB 13.5 12/24/2015   HCT 40.2 12/24/2015   PLT 227.0 12/24/2015   GLUCOSE 103 (H) 12/24/2015   CHOL 209 (H) 12/24/2015   TRIG 47.0 12/24/2015   HDL 71.90 12/24/2015   LDLCALC 127 (H) 12/24/2015   ALT 13 12/24/2015   AST 20 12/24/2015   NA 140 12/24/2015   K 4.3 12/24/2015   CL 104 12/24/2015   CREATININE 0.74 12/24/2015   BUN 19 12/24/2015   CO2 30 12/24/2015   TSH 0.48 12/24/2015   PSA 0.72 02/23/2013       Assessment & Plan:  .

## 2017-01-20 NOTE — Assessment & Plan Note (Signed)

## 2017-01-21 ENCOUNTER — Telehealth: Payer: Self-pay | Admitting: Internal Medicine

## 2017-01-21 MED ORDER — DICLOFENAC SODIUM 1 % TD GEL: 4.0000 g | Freq: Four times a day (QID) | TRANSDERMAL | 5 refills | Status: DC | PRN

## 2017-01-21 NOTE — Telephone Encounter (Signed)
This has been corrected and sent erx

## 2017-01-21 NOTE — Telephone Encounter (Signed)
Pt called regarding a medication that was suppose to be sent in yesterday [facility] [place]. He said that it was a gel for osteoarthritis? The pt would like a call when this has been sent in.

## 2017-01-21 NOTE — Telephone Encounter (Signed)
Pt has called twice checking on this medication from yesterdays visit. He would like a call when this has been sent.

## 2017-01-21 NOTE — Telephone Encounter (Signed)
Pt has been informed and expressed understanding.  

## 2017-01-21 NOTE — Telephone Encounter (Signed)
Dr. Jonny RuizJohn please advise. I do not see where any meds were sent in yesterday.

## 2017-04-20 ENCOUNTER — Ambulatory Visit (INDEPENDENT_AMBULATORY_CARE_PROVIDER_SITE_OTHER): Payer: Medicare Other | Admitting: General Practice

## 2017-04-20 DIAGNOSIS — Z23 Encounter for immunization: Secondary | ICD-10-CM

## 2017-04-20 NOTE — Progress Notes (Signed)
Medical screening examination/treatment/procedure(s) were performed by non-physician practitioner and as supervising physician I was immediately available for consultation/collaboration. I agree with above. Louise Rawson, MD   

## 2017-06-14 ENCOUNTER — Encounter: Payer: Self-pay | Admitting: Internal Medicine

## 2017-06-14 ENCOUNTER — Ambulatory Visit (INDEPENDENT_AMBULATORY_CARE_PROVIDER_SITE_OTHER): Payer: Medicare Other | Admitting: Internal Medicine

## 2017-06-14 ENCOUNTER — Other Ambulatory Visit (INDEPENDENT_AMBULATORY_CARE_PROVIDER_SITE_OTHER): Payer: Medicare Other

## 2017-06-14 VITALS — BP 108/64 | HR 60 | Temp 97.7°F | Ht 63.0 in | Wt 95.0 lb

## 2017-06-14 DIAGNOSIS — R7302 Impaired glucose tolerance (oral): Secondary | ICD-10-CM | POA: Diagnosis not present

## 2017-06-14 DIAGNOSIS — R413 Other amnesia: Secondary | ICD-10-CM | POA: Diagnosis not present

## 2017-06-14 DIAGNOSIS — G3184 Mild cognitive impairment, so stated: Secondary | ICD-10-CM | POA: Insufficient documentation

## 2017-06-14 DIAGNOSIS — E785 Hyperlipidemia, unspecified: Secondary | ICD-10-CM

## 2017-06-14 LAB — VITAMIN B12: Vitamin B-12: 844 pg/mL (ref 211–911)

## 2017-06-14 MED ORDER — DONEPEZIL HCL 5 MG PO TABS: 5.0000 mg | ORAL_TABLET | Freq: Every day | ORAL | 3 refills | Status: DC

## 2017-06-14 NOTE — Patient Instructions (Addendum)
Please take all new medication as prescribed - the low dose aricept 5 mg per day  You will be contacted regarding the referral for: Head MRI, and Neurology referral  Please continue all other medications as before, and refills have been done if requested.  Please have the pharmacy call with any other refills you may need.  Please continue your efforts at being more active, low cholesterol diet, and weight control  Please keep your appointments with your specialists as you may have planned  Please go to the LAB in the Basement (turn left off the elevator) for the tests to be done today - just the B12 level today  You will be contacted by phone if any changes need to be made immediately.  Otherwise, you will receive a letter about your results with an explanation, but please check with MyChart first.  Please remember to sign up for MyChart if you have not done so, as this will be important to you in the future with finding out test results, communicating by private email, and scheduling acute appointments online when needed.  Please return in 6 months, or sooner if needed

## 2017-06-14 NOTE — Assessment & Plan Note (Signed)
Most likely [facility] I suspect, will check B12, Head MRI, start aricept 5 qd, and wife asks for referral Neurology as well.

## 2017-06-14 NOTE — Progress Notes (Signed)
Subjective:    Patient ID: Jacob Mason, male    DOB: 01-Sep-1928, 83 y.o.   MRN: [id number]  HPI  Here with wife for support and both give hx of 3 mo new onset worsening forgetfulness and ST memory lapses, starting to ask questions hours later of discussion already had.  Pt denies new neurological symptoms such as new headache, or facial or extremity weakness or numbness  Pt denies chest pain, increased sob or doe, wheezing, orthopnea, PND, increased LE swelling, palpitations, dizziness or syncope.   Pt denies polydipsia, polyuria, Wt Readings from Last 3 Encounters:  06/14/17 95 lb (43.1 kg)  01/20/17 95 lb (43.1 kg)  12/31/15 100 lb (45.4 kg)   Past Medical History:  Diagnosis Date  . Anxiety state, unspecified 02/23/2013  . Bilateral hearing loss 02/23/2013  . BPH (benign prostatic hypertrophy)   . Cachexia (HCC) 03/14/2013  . Depression 03/14/2013  . Diverticulitis   . Hyperlipidemia 12/31/2015  . Impaired glucose tolerance 03/14/2013  . Insomnia 03/14/2013  . Irritable bowel 03/14/2013   Past Surgical History:  Procedure Laterality Date  . PROSTATE SURGERY    . SHOULDER SURGERY      reports that he has quit smoking. he has never used smokeless tobacco. He reports that he drinks alcohol. He reports that he does not use drugs. family history is not on file. No Known Allergies Current Outpatient Medications on File Prior to Visit  Medication Sig Dispense Refill  . Cholecalciferol (VITAMIN D) 2000 UNITS CAPS Take 1 capsule by mouth daily.    . diclofenac sodium (VOLTAREN) 1 % GEL Apply 4 g topically 4 (four) times daily as needed. 400 g 5  . Lutein 20 MG TABS Take by mouth daily.    . mirtazapine (REMERON) 15 MG tablet Take 1 tablet (15 mg total) by mouth at bedtime. 90 tablet 3  . Multiple Vitamin (MULTIVITAMIN) tablet Take 1 tablet by mouth daily.     No current facility-administered medications on file prior to visit.    Review of Systems  Constitutional: Negative for other unusual  diaphoresis or sweats HENT: Negative for ear discharge or swelling Eyes: Negative for other worsening visual disturbances Respiratory: Negative for stridor or other swelling  Gastrointestinal: Negative for worsening distension or other blood Genitourinary: Negative for retention or other urinary change Musculoskeletal: Negative for other MSK pain or swelling Skin: Negative for color change or other new lesions Neurological: Negative for worsening tremors and other numbness  Psychiatric/Behavioral: Negative for worsening agitation or other fatigue All other system neg per pt    Objective:   Physical Exam BP 108/64   Pulse 60   Temp 97.7 F (36.5 C) (Oral)   Ht 5\' 3"  (1.6 m)   Wt 95 lb (43.1 kg)   SpO2 99%   BMI 16.83 kg/m   VS noted,  Constitutional: Pt appears in NAD HENT: Head: NCAT.  Right Ear: External ear normal.  Left Ear: External ear normal.  Eyes: . Pupils are equal, round, and reactive to light. Conjunctivae and EOM are normal Nose: without d/c or deformity Neck: Neck supple. Gross normal ROM Cardiovascular: Normal rate and regular rhythm.   Pulmonary/Chest: Effort normal and breath sounds without rales or wheezing.  Neurological: Pt is alert. + mild cognitive difficulty and ST memory mistakes, motor grossly intact Skin: Skin is warm. No rashes, other new lesions, no LE edema Psychiatric: Pt behavior is normal without agitation  No other exam findings    Assessment &  Plan:

## 2017-06-15 ENCOUNTER — Encounter: Payer: Self-pay | Admitting: Neurology

## 2017-06-16 ENCOUNTER — Encounter: Payer: Self-pay | Admitting: Internal Medicine

## 2017-06-16 NOTE — Assessment & Plan Note (Signed)
stable overall by history and exam, recent data reviewed with pt, and pt to continue medical treatment as before,  to f/u any worsening symptoms or concerns ' Lab Results  Component Value Date   LDLCALC 112 (H) 01/20/2017  for lower chol diet, declines other med change

## 2017-06-16 NOTE — Assessment & Plan Note (Signed)
stable overall by history and exam, recent data reviewed with pt, and pt to continue medical treatment as before,  to f/u any worsening symptoms or concerns Lab Results  Component Value Date   HGBA1C 6.5 01/20/2017

## 2017-06-30 ENCOUNTER — Ambulatory Visit
Admission: RE | Admit: 2017-06-30 | Discharge: 2017-06-30 | Disposition: A | Discharge status: Home / Self Care | Payer: Medicare Other | Source: Ambulatory Visit | Attending: Internal Medicine | Admitting: Internal Medicine

## 2017-06-30 DIAGNOSIS — R413 Other amnesia: Secondary | ICD-10-CM

## 2017-06-30 DIAGNOSIS — R410 Disorientation, unspecified: Secondary | ICD-10-CM | POA: Diagnosis not present

## 2017-07-08 ENCOUNTER — Telehealth: Payer: Self-pay | Admitting: Internal Medicine

## 2017-07-08 MED ORDER — GALANTAMINE HYDROBROMIDE ER 8 MG PO CP24: 8.0000 mg | ORAL_CAPSULE | Freq: Every day | ORAL | 3 refills | Status: DC

## 2017-07-08 NOTE — Telephone Encounter (Signed)
Copied from CRM [phone]. Topic: Quick Communication - See Telephone Encounter >> Jul 08, 2017 11:02 AM Darletta MollLander, Lumin L wrote: CRM for notification. See Telephone encounter for: 07/08/17. Oliver BarreJohn James prescribed donetezil that upset his stomach. Would like to try something else.  [id number] - [place], [place] - [address]  Would like a call back once new script is sent to [facility].

## 2017-07-08 NOTE — Telephone Encounter (Signed)
Pt. States Jacob Mason is causing stomach pain and frequent bowel movements. Please call him if something else is prescribed.

## 2017-07-08 NOTE — Telephone Encounter (Signed)
Ok to change aricept to galantamine ER 8 mg - 1 per day

## 2017-07-13 ENCOUNTER — Ambulatory Visit: Payer: Medicare Other | Admitting: Internal Medicine

## 2017-07-13 ENCOUNTER — Encounter: Payer: Self-pay | Admitting: Internal Medicine

## 2017-07-13 VITALS — BP 104/60 | HR 66 | Temp 97.9°F | Ht 63.0 in | Wt 94.0 lb

## 2017-07-13 DIAGNOSIS — F0390 Unspecified dementia without behavioral disturbance: Secondary | ICD-10-CM

## 2017-07-13 DIAGNOSIS — R7302 Impaired glucose tolerance (oral): Secondary | ICD-10-CM

## 2017-07-13 DIAGNOSIS — K589 Irritable bowel syndrome without diarrhea: Secondary | ICD-10-CM | POA: Diagnosis not present

## 2017-07-13 NOTE — Patient Instructions (Signed)
Ok to stop the aricept (donepezil)  Please take all new medication as prescribed - the Galantamine (Razadyne)  Please continue all other medications as before, and refills have been done if requested.  Please have the pharmacy call with any other refills you may need.  Please keep your appointments with your specialists as you may have planned

## 2017-07-13 NOTE — Progress Notes (Signed)
Subjective:    Patient ID: Jacob Mason, male    DOB: [birth year], 83 y.o.   MRN: [id number]  HPI  Here to f/u with wife, he had called recently but does not recall phone back to him; pt states aricept unfortunately causes GI upset and diarrhea.  Denies worsening reflux, abd pain, dysphagia, or blood.  No fever. Dementia overall stable symptomatically, and not assoc with behavioral changes such as hallucinations, paranoia, or agitation.  Pt denies chest pain, increased sob or doe, wheezing, orthopnea, PND, increased LE swelling, palpitations, dizziness or syncope. Pt denies new neurological symptoms such as new headache, or facial or extremity weakness or numbness.   Pt denies polydipsia, polyuria Past Medical History:  Diagnosis Date  . Anxiety state, unspecified 02/23/2013  . Bilateral hearing loss 02/23/2013  . BPH (benign prostatic hypertrophy)   . Cachexia (HCC) 03/14/2013  . Depression 03/14/2013  . Diverticulitis   . Hyperlipidemia 12/31/2015  . Impaired glucose tolerance 03/14/2013  . Insomnia 03/14/2013  . Irritable bowel 03/14/2013   Past Surgical History:  Procedure Laterality Date  . PROSTATE SURGERY    . SHOULDER SURGERY      reports that he has quit smoking. he has never used smokeless tobacco. He reports that he drinks alcohol. He reports that he does not use drugs. family history is not on file. No Known Allergies Current Outpatient Medications on File Prior to Visit  Medication Sig Dispense Refill  . Cholecalciferol (VITAMIN D) 2000 UNITS CAPS Take 1 capsule by mouth daily.    . diclofenac sodium (VOLTAREN) 1 % GEL Apply 4 g topically 4 (four) times daily as needed. 400 g 5  . galantamine (RAZADYNE ER) 8 MG 24 hr capsule Take 1 capsule (8 mg total) by mouth daily with breakfast. 90 capsule 3  . Lutein 20 MG TABS Take by mouth daily.    . mirtazapine (REMERON) 15 MG tablet Take 1 tablet (15 mg total) by mouth at bedtime. 90 tablet 3  . Multiple Vitamin (MULTIVITAMIN) tablet Take 1  tablet by mouth daily.     No current facility-administered medications on file prior to visit.    Review of Systems   Constitutional: Negative for other unusual diaphoresis or sweats HENT: Negative for ear discharge or swelling Eyes: Negative for other worsening visual disturbances Respiratory: Negative for stridor or other swelling  Gastrointestinal: Negative for worsening distension or other blood Genitourinary: Negative for retention or other urinary change Musculoskeletal: Negative for other MSK pain or swelling Skin: Negative for color change or other new lesions Neurological: Negative for worsening tremors and other numbness  Psychiatric/Behavioral: Negative for worsening agitation or other fatigue All other system neg per pt Objective:   Physical Exam BP 104/60   Pulse 66   Temp 97.9 F (36.6 C) (Oral)   Ht 5\' 3"  (1.6 m)   Wt 94 lb (42.6 kg)   SpO2 98%   BMI 16.65 kg/m  VS noted, not ill appearing Constitutional: Pt appears in NAD HENT: Head: NCAT.  Right Ear: External ear normal.  Left Ear: External ear normal.  Eyes: . Pupils are equal, round, and reactive to light. Conjunctivae and EOM are normal Nose: without d/c or deformity Neck: Neck supple. Gross normal ROM Cardiovascular: Normal rate and regular rhythm.   Pulmonary/Chest: Effort normal and breath sounds without rales or wheezing.  Abd:  Soft, ND, + BS, no organomegaly, mild LLQ tenderness without guarding or rebound Neurological: Pt is alert. At baseline orientation, motor grossly  intact Skin: Skin is warm. No rashes, other new lesions, no LE edema Psychiatric: Pt behavior is normal without agitation  No other exam findings    Assessment & Plan:

## 2017-07-15 NOTE — Assessment & Plan Note (Signed)
Unclear how much prior IBS is element of current symptoms, declines bentyl trial,  to f/u any worsening symptoms or concerns

## 2017-07-15 NOTE — Assessment & Plan Note (Signed)
Lab Results  Component Value Date   HGBA1C 6.5 01/20/2017  stable overall by history and exam, recent data reviewed with pt, and pt to continue medical treatment as before,  to f/u any worsening symptoms or concerns

## 2017-07-15 NOTE — Assessment & Plan Note (Signed)
Stable symptomatically but with GI distress with aricept, resolved when he stopped.  OK to change to galantamine asd, consider add namenda, remains declines neurology referral,  to f/u any worsening symptoms or concerns

## 2017-08-26 MED FILL — SHINGRIX VIAL KIT: 50 | 1 days supply | Qty: 1 | Fill #0

## 2017-08-30 ENCOUNTER — Encounter: Payer: Self-pay | Admitting: Neurology

## 2017-08-30 ENCOUNTER — Ambulatory Visit: Payer: Medicare Other | Admitting: Neurology

## 2017-08-30 VITALS — BP 106/60 | HR 63 | Ht 63.0 in | Wt 94.0 lb

## 2017-08-30 DIAGNOSIS — G3184 Mild cognitive impairment, so stated: Secondary | ICD-10-CM | POA: Diagnosis not present

## 2017-08-30 NOTE — Progress Notes (Signed)
NEUROLOGY CONSULTATION NOTE  Jacob Mason MRN: [id number] DOB: August 27, 1928  Referring provider: Dr. Oliver BarreJames [name] Primary care provider: Dr. Oliver BarreJames [name]  Reason for consult:  Memory loss  Dear Dr Jonny RuizJohn:  Thank you for your kind referral of Jacob Mason for consultation of the above symptoms. Although his history is well known to you, please allow me to reiterate it for the purpose of our medical record. The patient was accompanied to the clinic by his wife who also provides collateral information. Records and images were personally reviewed where available.  HISTORY OF PRESENT ILLNESS: This is a pleasant 84 year old right-handed man with a history of diet-controlled hyperlipidemia, anxiety, depression, presenting for evaluation of worsening memory. When asked about his memory, he states "she says my memory is kina bad, I agree some." His wife started noticing changes around 3 months ago, he would repeat himself, asking the same questions repeatedly several days in a row. He has difficulty recalling events from a few hours prior. He cannot find important papers at home. He denies getting lost driving, no missed bills or medications. His wife denies any personality changes, no hallucinations. He had stomach trouble on Aricept, currently tolerating Galantamine ER 8mg  daily without side effects. He goes to the gym daily. There is no family history of dementia, no history of head injuries, he rarely drinks alcohol. He denies any headaches, dizziness, vision changes, neck/back pain, focal numbness/tingling/weakness, anosmia, tremors. No falls.   Laboratory Data: Lab Results  Component Value Date   TSH 0.45 01/20/2017   Lab Results  Component Value Date   VITAMINB12 844 06/14/2017   I personally reviewed MRI brain without contrast which did not show any acute changes. There was mild diffuse atrophy.   PAST MEDICAL HISTORY: Past Medical History:  Diagnosis Date  . Anxiety state, unspecified  02/23/2013  . Bilateral hearing loss 02/23/2013  . BPH (benign prostatic hypertrophy)   . Cachexia (HCC) 03/14/2013  . Depression 03/14/2013  . Diverticulitis   . Hyperlipidemia 12/31/2015  . Impaired glucose tolerance 03/14/2013  . Insomnia 03/14/2013  . Irritable bowel 03/14/2013    PAST SURGICAL HISTORY: Past Surgical History:  Procedure Laterality Date  . PROSTATE SURGERY    . SHOULDER SURGERY      MEDICATIONS: Current Outpatient Medications on File Prior to Visit  Medication Sig Dispense Refill  . Cholecalciferol (VITAMIN D) 2000 UNITS CAPS Take 1 capsule by mouth daily.    Marland Kitchen. galantamine (RAZADYNE ER) 8 MG 24 hr capsule Take 1 capsule (8 mg total) by mouth daily with breakfast. 90 capsule 3  . Lutein 20 MG TABS Take by mouth daily.    . mirtazapine (REMERON) 15 MG tablet Take 1 tablet (15 mg total) by mouth at bedtime. 90 tablet 3  . Multiple Vitamin (MULTIVITAMIN) tablet Take 1 tablet by mouth daily.     No current facility-administered medications on file prior to visit.     ALLERGIES: No Known Allergies  FAMILY HISTORY: History reviewed. No pertinent family history.  SOCIAL HISTORY: Social History   Socioeconomic History  . Marital status: Married    Spouse name: Not on file  . Number of children: 2  . Years of education: PHD  . Highest education level: Not on file  Social Needs  . Financial resource strain: Not on file  . Food insecurity - worry: Not on file  . Food insecurity - inability: Not on file  . Transportation needs - medical: Not on file  .  Transportation needs - non-medical: Not on file  Occupational History  . Occupation: Retired  Tobacco Use  . Smoking status: Former Games developer  . Smokeless tobacco: Never Used  Substance and Sexual Activity  . Alcohol use: Yes    Alcohol/week: 0.0 oz    Comment: occ   . Drug use: No  . Sexual activity: Not on file  Other Topics Concern  . 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     REVIEW OF SYSTEMS: Constitutional: No fevers, chills, or sweats, no generalized fatigue, change in appetite Eyes: No visual changes, double vision, eye pain Ear, nose and throat: No hearing loss, ear pain, nasal congestion, sore throat Cardiovascular: No chest pain, palpitations Respiratory:  No shortness of breath at rest or with exertion, wheezes GastrointestinaI: No nausea, vomiting, diarrhea, abdominal pain, fecal incontinence Genitourinary:  No dysuria, urinary retention or frequency Musculoskeletal:  No neck pain, back pain Integumentary: No rash, pruritus, skin lesions Neurological: as above Psychiatric: No depression, insomnia, anxiety Endocrine: No palpitations, fatigue, diaphoresis, mood swings, change in appetite, change in weight, increased thirst Hematologic/Lymphatic:  No anemia, purpura, petechiae. Allergic/Immunologic: no itchy/runny eyes, nasal congestion, recent allergic reactions, rashes  PHYSICAL EXAM: Vitals:   08/30/17 1359  BP: 106/60  Pulse: 63  SpO2: 97%   General: No acute distress Head:  Normocephalic/atraumatic Eyes: Fundoscopic exam shows bilateral sharp discs, no vessel changes, exudates, or hemorrhages Neck: supple, no paraspinal tenderness, full range of motion Back: No paraspinal tenderness Heart: regular rate and rhythm Lungs: Clear to auscultation bilaterally. Vascular: No carotid bruits. Skin/Extremities: No rash, no edema Neurological Exam: Mental status: alert and oriented to person, place, and time, no dysarthria or aphasia, Fund of knowledge is appropriate.  Recent and remote memory are intact.  Attention and concentration are normal.    Able to name objects and repeat phrases.  Montreal Cognitive Assessment  08/30/2017  Visuospatial/ Executive (0/5) 5  Naming (0/3) 3  Attention: Read list of digits (0/2) 2  Attention: Read  list of letters (0/1) 1  Attention: Serial 7 subtraction starting at 100 (0/3) 3  Language: Repeat phrase (0/2) 2  Language : Fluency (0/1) 1  Abstraction (0/2) 2  Delayed Recall (0/5) 1  Orientation (0/6) 6  Total 26   Cranial nerves: CN I: not tested CN II: pupils equal, round and reactive to light, visual fields intact, fundi unremarkable. CN III, IV, VI:  full range of motion, no nystagmus, no ptosis CN V: facial sensation intact CN VII: upper and lower face symmetric CN VIII: hearing intact to finger rub CN IX, X: gag intact, uvula midline CN XI: sternocleidomastoid and trapezius muscles intact CN XII: tongue midline Bulk & Tone: normal, no fasciculations. Motor: 5/5 throughout with no pronator drift. Sensation: intact to light touch, cold, pin, vibration and joint position sense.  No extinction to double simultaneous stimulation.  Romberg test negative Deep Tendon Reflexes: +2 throughout, no ankle clonus Plantar responses: downgoing bilaterally Cerebellar: no incoordination on finger to nose, heel to shin. No dysdiadochokinesia Gait: slightly wide-based, no ataxia, able to tandem walk adequately. Tremor: none  IMPRESSION: This is a pleasant 84 year old right-handed man with a history of diet-controlled hyperlipidemia, insomnia, anxiety, depression, presenting for worsening memory. His neurological exam is normal, MOCA score 26/30. MRI showed mild diffuse atrophy.  TSH and B12 normal. Symptoms suggestive of mild cognitive impairment. He is able to perform complex tasks without significant difficulties. Continue galantamine ER 8mg  daily. We discussed the diagnosis of Mild cognitive impairment, that there are serious cognitive problems by report and testing but the patient is functioning normally. Around 50% of [facility]CI patients progress to dementia (functional impairment) over 5 years. We discussed the importance of control of vascular risk factors, physical exercise, and brain stimulation  exercises. All their questions were answered. He will follow-up in 6 months and knows to call for any changes.   Thank you for allowing me to participate in the care of this patient. Please do not hesitate to call for any questions or concerns.   Patrcia Dolly, M.D.  CC: Dr. Jonny Ruiz

## 2017-08-30 NOTE — Patient Instructions (Signed)
1. Continue Galantamine as prescribed 2. Follow-up in 6 months, call for any changes  RECOMMENDATIONS FOR ALL PATIENTS WITH MEMORY PROBLEMS: 1. Continue to exercise (Recommend 30 minutes of walking everyday, or 3 hours every week) 2. Increase social interactions - continue going to [place] and enjoy social gatherings with friends and family 3. Eat healthy, avoid fried foods and eat more fruits and vegetables 4. Maintain adequate blood pressure, blood sugar, and blood cholesterol level. Reducing the risk of stroke and cardiovascular disease also helps promoting better memory. 5. Avoid stressful situations. Live a simple life and avoid aggravations. Organize your time and prepare for the next day in anticipation. 6. Sleep well, avoid any interruptions of sleep and avoid any distractions in the bedroom that may interfere with adequate sleep quality 7. Avoid sugar, avoid sweets as there is a strong link between excessive sugar intake, diabetes, and cognitive impairment We discussed the Mediterranean diet, which has been shown to help patients reduce the risk of progressive memory disorders and reduces cardiovascular risk. This includes eating fish, eat fruits and green leafy vegetables, nuts like almonds and hazelnuts, walnuts, and also use olive oil. Avoid fast foods and fried foods as much as possible. Avoid sweets and sugar as sugar use has been linked to worsening of memory function.

## 2017-08-31 ENCOUNTER — Ambulatory Visit: Payer: Medicare Other | Admitting: Internal Medicine

## 2017-08-31 ENCOUNTER — Encounter: Payer: Self-pay | Admitting: Internal Medicine

## 2017-08-31 VITALS — BP 116/68 | HR 68 | Temp 99.3°F | Ht 63.0 in | Wt 94.0 lb

## 2017-08-31 DIAGNOSIS — F0390 Unspecified dementia without behavioral disturbance: Secondary | ICD-10-CM

## 2017-08-31 DIAGNOSIS — R05 Cough: Secondary | ICD-10-CM

## 2017-08-31 DIAGNOSIS — R059 Cough, unspecified: Secondary | ICD-10-CM

## 2017-08-31 DIAGNOSIS — R7302 Impaired glucose tolerance (oral): Secondary | ICD-10-CM

## 2017-08-31 MED ORDER — HYDROCODONE-HOMATROPINE 5-1.5 MG/5ML PO SYRP: 5.0000 mL | ORAL_SOLUTION | Freq: Four times a day (QID) | ORAL | 0 refills | Status: AC | PRN

## 2017-08-31 MED ORDER — OSELTAMIVIR PHOSPHATE 75 MG PO CAPS: 75.0000 mg | ORAL_CAPSULE | Freq: Two times a day (BID) | ORAL | 0 refills | Status: DC

## 2017-08-31 MED ORDER — AZITHROMYCIN 250 MG PO TABS: ORAL_TABLET | ORAL | 1 refills | Status: DC

## 2017-08-31 NOTE — Patient Instructions (Addendum)
Please take all new medication as prescribed - the tamiflu, and Zpack antibiotics (sent to the pharmacy)  Please take all new medication as prescribed - the cough medicine if needed (done in hardcopy)  Please continue all other medications as before, and refills have been done if requested.  Please have the pharmacy call with any other refills you may need.  Please keep your appointments with your specialists as you may have planned

## 2017-08-31 NOTE — Assessment & Plan Note (Signed)
Lab Results  Component Value Date   HGBA1C 6.5 01/20/2017  stable overall by history and exam, recent data reviewed with pt, and pt to continue medical treatment as before,  to f/u any worsening symptoms or concerns  

## 2017-08-31 NOTE — Assessment & Plan Note (Signed)
Without worsening confusion for now, stable overall by history and exam,  and pt to continue medical treatment as before,  to f/u any worsening symptoms or concerns

## 2017-08-31 NOTE — Assessment & Plan Note (Signed)
Mild to mod, c/w flu like illness vs pna, will tx with empiric tamiflu, also for zpack course, declines cxr, to f/u any worsening symptoms or concerns

## 2017-08-31 NOTE — Progress Notes (Signed)
Subjective:    Patient ID: Jacob Mason, male    DOB: [birth year], 84 y.o.   MRN: [id number]  HPI  Here with acute onset diffuse myalgias, feverish, mild to mod 2-3 days ST, HA, general weakness and malaise, with prod cough clearish but now greenish sputum, but Pt denies chest pain, increased sob or doe, wheezing, orthopnea, PND, increased LE swelling, palpitations, dizziness or syncope.   Pt denies polydipsia, polyuria.   Pt denies new neurological symptoms such as new headache, or facial or extremity weakness or numbness  No other interval hx or other complaints.  Dementia overall stable symptomatically, and not assoc with behavioral changes such as hallucinations, paranoia, or agitation. Past Medical History:  Diagnosis Date  . Anxiety state, unspecified 02/23/2013  . Bilateral hearing loss 02/23/2013  . BPH (benign prostatic hypertrophy)   . Cachexia (HCC) 03/14/2013  . Depression 03/14/2013  . Diverticulitis   . Hyperlipidemia 12/31/2015  . Impaired glucose tolerance 03/14/2013  . Insomnia 03/14/2013  . Irritable bowel 03/14/2013   Past Surgical History:  Procedure Laterality Date  . PROSTATE SURGERY    . SHOULDER SURGERY      reports that he has quit smoking. he has never used smokeless tobacco. He reports that he drinks alcohol. He reports that he does not use drugs. family history is not on file. No Known Allergies Current Outpatient Medications on File Prior to Visit  Medication Sig Dispense Refill  . Cholecalciferol (VITAMIN D) 2000 UNITS CAPS Take 1 capsule by mouth daily.    Marland Kitchen. galantamine (RAZADYNE ER) 8 MG 24 hr capsule Take 1 capsule (8 mg total) by mouth daily with breakfast. 90 capsule 3  . Lutein 20 MG TABS Take by mouth daily.    . mirtazapine (REMERON) 15 MG tablet Take 1 tablet (15 mg total) by mouth at bedtime. 90 tablet 3  . Multiple Vitamin (MULTIVITAMIN) tablet Take 1 tablet by mouth daily.     No current facility-administered medications on file prior to visit.     Review of Systems  Constitutional: Negative for other unusual diaphoresis or sweats HENT: Negative for ear discharge or swelling Eyes: Negative for other worsening visual disturbances Respiratory: Negative for stridor or other swelling  Gastrointestinal: Negative for worsening distension or other blood Genitourinary: Negative for retention or other urinary change Musculoskeletal: Negative for other MSK pain or swelling Skin: Negative for color change or other new lesions Neurological: Negative for worsening tremors and other numbness  Psychiatric/Behavioral: Negative for worsening agitation or other fatigue All other system neg per pt    Objective:   Physical Exam BP 116/68   Pulse 68   Temp 99.3 F (37.4 C) (Oral)   Ht 5\' 3"  (1.6 m)   Wt 94 lb (42.6 kg)   SpO2 99%   BMI 16.65 kg/m  VS noted, mild ill Constitutional: Pt appears in NAD HENT: Head: NCAT.  Right Ear: External ear normal.  Left Ear: External ear normal.  Eyes: . Pupils are equal, round, and reactive to light. Conjunctivae and EOM are normal Bilat tm's with mild erythema.  Max sinus areas non tender.  Pharynx with mild erythema, no exudate Nose: without d/c or deformity Neck: Neck supple. Gross normal ROM Cardiovascular: Normal rate and regular rhythm.   Pulmonary/Chest: Effort normal and breath sounds decreased without rales or wheezing.  Neurological: Pt is alert. At baseline orientation, motor grossly intact Skin: Skin is warm. No rashes, other new lesions, no LE edema Psychiatric: Pt behavior is  normal without agitation  No other exam findings    Assessment & Plan:

## 2017-09-06 ENCOUNTER — Encounter: Payer: Self-pay | Admitting: Neurology

## 2017-10-08 DIAGNOSIS — H52223 Regular astigmatism, bilateral: Secondary | ICD-10-CM | POA: Diagnosis not present

## 2017-10-29 MED FILL — SHINGRIX VIAL KIT: 50 | 1 days supply | Qty: 1 | Fill #1

## 2017-12-13 ENCOUNTER — Encounter: Payer: Self-pay | Admitting: Internal Medicine

## 2017-12-13 ENCOUNTER — Ambulatory Visit: Payer: Medicare Other | Admitting: Internal Medicine

## 2017-12-13 VITALS — BP 116/72 | HR 61 | Temp 98.1°F | Ht 63.0 in | Wt 95.0 lb

## 2017-12-13 DIAGNOSIS — E785 Hyperlipidemia, unspecified: Secondary | ICD-10-CM

## 2017-12-13 DIAGNOSIS — F329 Major depressive disorder, single episode, unspecified: Secondary | ICD-10-CM

## 2017-12-13 DIAGNOSIS — F32A Depression, unspecified: Secondary | ICD-10-CM

## 2017-12-13 DIAGNOSIS — R7302 Impaired glucose tolerance (oral): Secondary | ICD-10-CM

## 2017-12-13 MED ORDER — MIRTAZAPINE 15 MG PO TABS: 15.0000 mg | ORAL_TABLET | Freq: Every day | ORAL | 3 refills | Status: DC

## 2017-12-13 MED ORDER — GALANTAMINE HYDROBROMIDE ER 8 MG PO CP24: 8.0000 mg | ORAL_CAPSULE | Freq: Every day | ORAL | 3 refills | Status: DC

## 2017-12-13 NOTE — Progress Notes (Signed)
   Subjective:    Patient ID: Jacob Mason, male    DOB: 11/29/1928, 84 y.o.   MRN: [id number]  HPI  Here to f/u; overall doing ok,  Pt denies chest pain, increasing sob or doe, wheezing, orthopnea, PND, increased LE swelling, palpitations, dizziness or syncope.  Pt denies new neurological symptoms such as new headache, or facial or extremity weakness or numbness.  Pt denies polydipsia, polyuria, or low sugar episode.  Pt states overall good compliance with meds, mostly trying to follow appropriate diet, with wt overall stable,  Wt Readings from Last 3 Encounters:  12/13/17 95 lb (43.1 kg)  08/31/17 94 lb (42.6 kg)  08/30/17 94 lb (42.6 kg)  Pt states memory not worse, but wife states maybe somewhat worse.   Pt denies fever, wt loss, night sweats, loss of appetite, or other constitutional symptoms.  Denies worsening depressive symptoms, suicidal ideation, or panic Past Medical History:  Diagnosis Date  . Anxiety state, unspecified 02/23/2013  . Bilateral hearing loss 02/23/2013  . BPH (benign prostatic hypertrophy)   . Cachexia (HCC) 03/14/2013  . Depression 03/14/2013  . Diverticulitis   . Hyperlipidemia 12/31/2015  . Impaired glucose tolerance 03/14/2013  . Insomnia 03/14/2013  . Irritable bowel 03/14/2013   Past Surgical History:  Procedure Laterality Date  . PROSTATE SURGERY    . SHOULDER SURGERY      reports that he has quit smoking. He has never used smokeless tobacco. He reports that he drinks alcohol. He reports that he does not use drugs. family history is not on file. No Known Allergies Current Outpatient Medications on File Prior to Visit  Medication Sig Dispense Refill  . Cholecalciferol (VITAMIN D) 2000 UNITS CAPS Take 1 capsule by mouth daily.    . Lutein 20 MG TABS Take by mouth daily.    . Multiple Vitamin (MULTIVITAMIN) tablet Take 1 tablet by mouth daily.     No current facility-administered medications on file prior to visit.    Review of Systems  Constitutional: Negative  for other unusual diaphoresis or sweats HENT: Negative for ear discharge or swelling Eyes: Negative for other worsening visual disturbances Respiratory: Negative for stridor or other swelling  Gastrointestinal: Negative for worsening distension or other blood Genitourinary: Negative for retention or other urinary change Musculoskeletal: Negative for other MSK pain or swelling Skin: Negative for color change or other new lesions Neurological: Negative for worsening tremors and other numbness  Psychiatric/Behavioral: Negative for worsening agitation or other fatigue All other system neg per pt    Objective:   Physical Exam BP 116/72   Pulse 61   Temp 98.1 F (36.7 C) (Oral)   Ht  (1.6 m)   Wt 95 lb (43.1 kg)   SpO2 97%   BMI 16.83 kg/m  VS noted,  Constitutional: Pt appears in NAD HENT: Head: NCAT.  Right Ear: External ear normal.  Left Ear: External ear normal.  Eyes: . Pupils are equal, round, and reactive to light. Conjunctivae and EOM are normal Nose: without d/c or deformity Neck: Neck supple. Gross normal ROM Cardiovascular: Normal rate and regular rhythm.   Pulmonary/Chest: Effort normal and breath sounds without rales or wheezing.  Neurological: Pt is alert. At baseline orientation, motor grossly intact Skin: Skin is warm. No rashes, other new lesions, no LE edema Psychiatric: Pt behavior is normal without agitation  No other exam findings    Assessment & Plan:

## 2017-12-13 NOTE — Patient Instructions (Signed)
Please continue all other medications as before, and refills have been done if requested.  Please have the pharmacy call with any other refills you may need.  Please continue your efforts at being more active, low cholesterol diet, and weight control.  You are otherwise up to date with prevention measures today.  Please keep your appointments with your specialists as you may have planned  Please return in 3 months, or sooner if needed, with Lab testing done 3-5 days before  

## 2017-12-13 NOTE — Assessment & Plan Note (Signed)
For lower chol diet, f/u lab today 

## 2017-12-13 NOTE — Assessment & Plan Note (Signed)
stable overall by history and exam, and pt to continue medical treatment as before,  to f/u any worsening symptoms or concerns 

## 2017-12-13 NOTE — Assessment & Plan Note (Signed)
stable overall by history and exam, recent data reviewed with pt, and pt to continue medical treatment as before,  to f/u any worsening symptoms or concerns Lab Results  Component Value Date   HGBA1C 6.5 01/20/2017   

## 2018-03-04 ENCOUNTER — Ambulatory Visit: Payer: Medicare Other | Admitting: Neurology

## 2018-03-04 ENCOUNTER — Encounter: Payer: Self-pay | Admitting: Neurology

## 2018-03-04 VITALS — BP 110/70 | HR 60 | Ht 63.0 in | Wt 93.0 lb

## 2018-03-04 DIAGNOSIS — G3184 Mild cognitive impairment, so stated: Secondary | ICD-10-CM | POA: Diagnosis not present

## 2018-03-04 MED ORDER — GALANTAMINE HYDROBROMIDE ER 16 MG PO CP24: 16.0000 mg | ORAL_CAPSULE | Freq: Every day | ORAL | 3 refills | Status: DC

## 2018-03-04 NOTE — Patient Instructions (Signed)
1. Increase Galantamine to 16mg  daily. With your current bottle of Galantamine 8mg , take 2 tablets daily. Once finished, your new bottle will be for Galantamine 16mg , take 1 tablet daily.  2. Follow-up in 6-8 months, call for any changes.  FALL PRECAUTIONS: Be cautious when walking. Scan the area for obstacles that may increase the risk of trips and falls. When getting up in the mornings, sit up at the edge of the bed for a few minutes before getting out of bed. Consider elevating the bed at the head end to avoid drop of blood pressure when getting up. Walk always in a well-lit room (use night lights in the walls). Avoid area rugs or power cords from appliances in the middle of the walkways. Use a walker or a cane if necessary and consider physical therapy for balance exercise. Get your eyesight checked regularly.  FINANCIAL OVERSIGHT: Supervision, especially oversight when making financial decisions or transactions is also recommended.  HOME SAFETY: Consider the safety of the kitchen when operating appliances like stoves, microwave oven, and blender. Consider having supervision and share cooking responsibilities until no longer able to participate in those. Accidents with firearms and other hazards in the house should be identified and addressed as well.  DRIVING: Regarding driving, in patients with progressive memory problems, driving will be impaired. We advise to have someone else do the driving if trouble finding directions or if minor accidents are reported. Independent driving assessment is available to determine safety of driving.  ABILITY TO BE LEFT ALONE: If patient is unable to contact 911 operator, consider using LifeLine, or when the need is there, arrange for someone to stay with patients. Smoking is a fire hazard, consider supervision or cessation. Risk of wandering should be assessed by caregiver and if detected at any point, supervision and safe proof recommendations should be  instituted.  MEDICATION SUPERVISION: Inability to self-administer medication needs to be constantly addressed. Implement a mechanism to ensure safe administration of the medications.  RECOMMENDATIONS FOR ALL PATIENTS WITH MEMORY PROBLEMS: 1. Continue to exercise (Recommend 30 minutes of walking everyday, or 3 hours every week) 2. Increase social interactions - continue going to [place] and enjoy social gatherings with friends and family 3. Eat healthy, avoid fried foods and eat more fruits and vegetables 4. Maintain adequate blood pressure, blood sugar, and blood cholesterol level. Reducing the risk of stroke and cardiovascular disease also helps promoting better memory. 5. Avoid stressful situations. Live a simple life and avoid aggravations. Organize your time and prepare for the next day in anticipation. 6. Sleep well, avoid any interruptions of sleep and avoid any distractions in the bedroom that may interfere with adequate sleep quality 7. Avoid sugar, avoid sweets as there is a strong link between excessive sugar intake, diabetes, and cognitive impairment The Mediterranean diet has been shown to help patients reduce the risk of progressive memory disorders and reduces cardiovascular risk. This includes eating fish, eat fruits and green leafy vegetables, nuts like almonds and hazelnuts, walnuts, and also use olive oil. Avoid fast foods and fried foods as much as possible. Avoid sweets and sugar as sugar use has been linked to worsening of memory function.  There is always a concern of gradual progression of memory problems. If this is the case, then we may need to adjust level of care according to patient needs. Support, both to the patient and caregiver, should then be put into place.

## 2018-03-04 NOTE — Progress Notes (Signed)
NEUROLOGY FOLLOW UP OFFICE NOTE  Jacob Mason [id number] 26-Jan-1929  HISTORY OF PRESENT ILLNESS: I had the pleasure of seeing Jacob Mason in follow-up in the neurology clinic on 03/04/2018.  The patient was last seen on 6 months ago for mild cognitive impairment and is again accompanied by his wife who helps supplement the history today. MOCA score in January 2019 was 26/30. He is on Galantamine ER 8mg  daily without side effects. He and his wife report worsening of memory. His wife states he asks the same questions repeatedly. He would bring the trash out to their driveway then ask her who put it outside. He continues to drive without getting lost. They deny any missed bills or medications. Sleep is good. He is independent with dressing and bathing. He denies any headaches, dizziness, vision changes, focal numbness/tingling/weakness, bowel/bladder dysfunction, no falls.   History on Initial Assessment: This is a pleasant 84 yo RH man with a history of diet-controlled hyperlipidemia, anxiety, depression, who presented for evaluation of worsening memory. When asked about his memory, he states "she says my memory is kina bad, I agree some." His wife started noticing changes around 3 months ago, he would repeat himself, asking the same questions repeatedly several days in a row. He has difficulty recalling events from a few hours prior. He cannot find important papers at home. He denies getting lost driving, no missed bills or medications. His wife denies any personality changes, no hallucinations. He had stomach trouble on Aricept, currently tolerating Galantamine ER 8mg  daily without side effects. He goes to the gym daily. There is no family history of dementia, no history of head injuries, he rarely drinks alcohol. He denies any headaches, dizziness, vision changes, neck/back pain, focal numbness/tingling/weakness, anosmia, tremors. No falls.   PAST MEDICAL HISTORY: Past Medical History:  Diagnosis Date  .  Anxiety state, unspecified 02/23/2013  . Bilateral hearing loss 02/23/2013  . BPH (benign prostatic hypertrophy)   . Cachexia (HCC) 03/14/2013  . Depression 03/14/2013  . Diverticulitis   . Hyperlipidemia 12/31/2015  . Impaired glucose tolerance 03/14/2013  . Insomnia 03/14/2013  . Irritable bowel 03/14/2013    MEDICATIONS: Current Outpatient Medications on File Prior to Visit  Medication Sig Dispense Refill  . Cholecalciferol (VITAMIN D) 2000 UNITS CAPS Take 1 capsule by mouth daily.    Marland Kitchen galantamine (RAZADYNE ER) 8 MG 24 hr capsule Take 1 capsule (8 mg total) by mouth daily with breakfast. 90 capsule 3  . Lutein 20 MG TABS Take by mouth daily.    . mirtazapine (REMERON) 15 MG tablet Take 1 tablet (15 mg total) by mouth at bedtime. 90 tablet 3  . Multiple Vitamin (MULTIVITAMIN) tablet Take 1 tablet by mouth daily.     No current facility-administered medications on file prior to visit.     ALLERGIES: No Known Allergies  FAMILY HISTORY: No family history on file.  SOCIAL HISTORY: Social History   Socioeconomic History  . Marital status: Married    Spouse name: Not on file  . Number of children: 2  . Years of education: PHD  . Highest education level: Not on file  Occupational History  . Occupation: Retired  Engineer, production  . Financial resource strain: Not on file  . Food insecurity:    Worry: Not on file    Inability: Not on file  . Transportation needs:    Medical: Not on file    Non-medical: Not on file  Tobacco Use  . Smoking status: Former  Smoker  . Smokeless tobacco: Never Used  Substance and Sexual Activity  . Alcohol use: Yes    Alcohol/week: 0.0 oz    Comment: occ   . Drug use: No  . Sexual activity: Not on file  Lifestyle  . Physical activity:    Days per week: Not on file    Minutes per session: Not on file  . Stress: Not on file  Relationships  . Social connections:    Talks on phone: Not on file    Gets together: Not on file    Attends religious  service: Not on file    Active member of club or organization: Not on file    Attends meetings of clubs or organizations: Not on file    Relationship status: Not on file  . Intimate partner violence:    Fear of current or ex partner: Not on file    Emotionally abused: Not on file    Physically abused: Not on file    Forced sexual activity: Not on file  Other Topics Concern  . 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     REVIEW OF SYSTEMS: Constitutional: No fevers, chills, or sweats, no generalized fatigue, change in appetite Eyes: No visual changes, double vision, eye pain Ear, nose and throat: No hearing loss, ear pain, nasal congestion, sore throat Cardiovascular: No chest pain, palpitations Respiratory:  No shortness of breath at rest or with exertion, wheezes GastrointestinaI: No nausea, vomiting, diarrhea, abdominal pain, fecal incontinence Genitourinary:  No dysuria, urinary retention or frequency Musculoskeletal:  No neck pain, back pain Integumentary: No rash, pruritus, skin lesions Neurological: as above Psychiatric: No depression, insomnia, anxiety Endocrine: No palpitations, fatigue, diaphoresis, mood swings, change in appetite, change in weight, increased thirst Hematologic/Lymphatic:  No anemia, purpura, petechiae. Allergic/Immunologic: no itchy/runny eyes, nasal congestion, recent allergic reactions, rashes  PHYSICAL EXAM: Vitals:   03/04/18 1524  BP: (!) 96/54  Pulse: 60  SpO2: 98%   General: No acute distress Head:  Normocephalic/atraumatic Neck: supple, no paraspinal tenderness, full range of motion Heart:  Regular rate and rhythm Lungs:  Clear to auscultation bilaterally Back: No paraspinal tenderness Skin/Extremities: No rash, no edema Neurological Exam: alert and oriented to person, place, and time. No aphasia  or dysarthria. Fund of knowledge is appropriate.  Remote memory intact.  Attention and concentration are normal.    Able to name objects and repeat phrases. Able to name 31 F words in 1 minute (nl >11). CDT 5/5 MMSE - Mini Mental State Exam 03/04/2018  Orientation to time 4  Orientation to Place 5  Registration 3  Attention/ Calculation 5  Recall 0  Language- name 2 objects 2  Language- repeat 1  Language- follow 3 step command 2  Language- read & follow direction 1  Write a sentence 1  Copy design 1  Total score 25    Montreal Cognitive Assessment  08/30/2017  Visuospatial/ Executive (0/5) 5  Naming (0/3) 3  Attention: Read list of digits (0/2) 2  Attention: Read list of letters (0/1) 1  Attention: Serial 7 subtraction starting at 100 (0/3) 3  Language: Repeat phrase (0/2) 2  Language : Fluency (0/1) 1  Abstraction (0/2) 2  Delayed Recall (0/5) 1  Orientation (0/6) 6  Total 26   Cranial nerves: Pupils equal, round,  reactive to light. Extraocular movements intact with no nystagmus. Visual fields full. Facial sensation intact. No facial asymmetry. Tongue, uvula, palate midline.  Motor: Bulk and tone normal, muscle strength 5/5 throughout with no pronator drift.  Sensation to light touch intact.  No extinction to double simultaneous stimulation.  Deep tendon reflexes +1 throughout, toes downgoing.  Finger to nose testing intact.  Gait narrow-based and steady, able to tandem walk adequately.  Romberg negative.  IMPRESSION: This is a pleasant 84 yo RH man with a history of diet-controlled hyperlipidemia, insomnia, anxiety, depression, with mild cognitive impairment. MMSE today 25/30, similar to prior testing. His wife reports increasing forgetfulness, but he appears to continue to be able to perform complex tasks without significant difficulties. We discussed increasing galantamine ER to 16mg  daily. We again discussed the importance of control of vascular risk factors, physical exercise,  and brain stimulation exercises. He will follow-up in 6 months or so and knows to call for any changes.   Thank you for allowing me to participate in her care.  Please do not hesitate to call for any questions or concerns.  The duration of this appointment visit was 30 minutes of face-to-face time with the patient.  Greater than 50% of this time was spent in counseling, explanation of diagnosis, planning of further management, and coordination of care.   Patrcia DollyKaren Kalee Mcclenathan, M.D.   CC: Dr. Jonny RuizJohn

## 2018-03-17 ENCOUNTER — Ambulatory Visit: Payer: Medicare Other | Admitting: Internal Medicine

## 2018-03-17 ENCOUNTER — Other Ambulatory Visit (INDEPENDENT_AMBULATORY_CARE_PROVIDER_SITE_OTHER): Payer: Medicare Other

## 2018-03-17 ENCOUNTER — Encounter: Payer: Self-pay | Admitting: Internal Medicine

## 2018-03-17 VITALS — BP 116/62 | HR 65 | Temp 97.8°F | Ht 63.0 in | Wt 95.0 lb

## 2018-03-17 DIAGNOSIS — Z0000 Encounter for general adult medical examination without abnormal findings: Secondary | ICD-10-CM

## 2018-03-17 DIAGNOSIS — R7302 Impaired glucose tolerance (oral): Secondary | ICD-10-CM | POA: Diagnosis not present

## 2018-03-17 LAB — LIPID PANEL
CHOLESTEROL: 192 mg/dL (ref 0–200)
HDL: 84.5 mg/dL (ref >39.00)
LDL Cholesterol: 94 mg/dL (ref 0–99)
NONHDL: 107.47
TRIGLYCERIDES: 66 mg/dL (ref 0.0–149.0)
Total CHOL/HDL Ratio: 2
VLDL: 13.2 mg/dL (ref 0.0–40.0)

## 2018-03-17 LAB — HEPATIC FUNCTION PANEL
ALBUMIN: 4.1 g/dL (ref 3.5–5.2)
ALT: 14 U/L (ref 0–53)
AST: 21 U/L (ref 0–37)
Alkaline Phosphatase: 65 U/L (ref 39–117)
BILIRUBIN TOTAL: 0.5 mg/dL (ref 0.2–1.2)
Bilirubin, Direct: 0.1 mg/dL (ref 0.0–0.3)
TOTAL PROTEIN: 7.1 g/dL (ref 6.0–8.3)

## 2018-03-17 LAB — URINALYSIS, ROUTINE W REFLEX MICROSCOPIC
Bilirubin Urine: NEGATIVE
Hgb urine dipstick: NEGATIVE
KETONES UR: NEGATIVE
LEUKOCYTES UA: NEGATIVE
Nitrite: NEGATIVE
PH: 6 (ref 5.0–8.0)
Total Protein, Urine: 30 — AB
UROBILINOGEN UA: 0.2 (ref 0.0–1.0)
Urine Glucose: NEGATIVE

## 2018-03-17 LAB — CBC WITH DIFFERENTIAL/PLATELET
Basophils Absolute: 0 10*3/uL (ref 0.0–0.1)
Basophils Relative: 0.3 % (ref 0.0–3.0)
EOS PCT: 0.5 % (ref 0.0–5.0)
Eosinophils Absolute: 0 10*3/uL (ref 0.0–0.7)
HEMATOCRIT: 38 % — AB (ref 39.0–52.0)
HEMOGLOBIN: 12.6 g/dL — AB (ref 13.0–17.0)
Lymphocytes Relative: 17.9 % (ref 12.0–46.0)
Lymphs Abs: 0.8 10*3/uL (ref 0.7–4.0)
MCHC: 33.3 g/dL (ref 30.0–36.0)
MCV: 98 fl (ref 78.0–100.0)
MONO ABS: 0.4 10*3/uL (ref 0.1–1.0)
MONOS PCT: 9.7 % (ref 3.0–12.0)
Neutro Abs: 3.1 10*3/uL (ref 1.4–7.7)
Neutrophils Relative %: 71.6 % (ref 43.0–77.0)
Platelets: 211 10*3/uL (ref 150.0–400.0)
RBC: 3.87 Mil/uL — AB (ref 4.22–5.81)
RDW: 15 % (ref 11.5–15.5)
WBC: 4.3 10*3/uL (ref 4.0–10.5)

## 2018-03-17 LAB — BASIC METABOLIC PANEL
BUN: 20 mg/dL (ref 6–23)
CALCIUM: 9.5 mg/dL (ref 8.4–10.5)
CO2: 32 mEq/L (ref 19–32)
Chloride: 101 mEq/L (ref 96–112)
Creatinine, Ser: 0.84 mg/dL (ref 0.40–1.50)
GFR: 91.86 mL/min (ref >60.00)
Glucose, Bld: 154 mg/dL — ABNORMAL HIGH (ref 70–99)
POTASSIUM: 4.1 meq/L (ref 3.5–5.1)
SODIUM: 139 meq/L (ref 135–145)

## 2018-03-17 LAB — HEMOGLOBIN A1C: Hgb A1c MFr Bld: 6.4 % (ref 4.6–6.5)

## 2018-03-17 LAB — TSH: TSH: 0.42 u[IU]/mL (ref 0.35–4.50)

## 2018-03-17 NOTE — Assessment & Plan Note (Signed)

## 2018-03-17 NOTE — Patient Instructions (Signed)

## 2018-03-17 NOTE — Assessment & Plan Note (Signed)
Lab Results  Component Value Date   HGBA1C 6.4 03/17/2018   stable overall by history and exam, recent data reviewed with pt, and pt to continue medical treatment as before,  to f/u any worsening symptoms or concerns

## 2018-03-17 NOTE — Progress Notes (Signed)
Subjective:    Patient ID: Jacob Mason, male    DOB: 1929/01/30, 84 y.o.   MRN: [id number]  HPI   Here for wellness and f/u with wife; hx limited due to dementia, Overall doing ok;  Pt denies Chest pain, worsening SOB, DOE, wheezing, orthopnea, PND, worsening LE edema, palpitations, dizziness or syncope.  Pt denies neurological change such as new headache, facial or extremity weakness.  Pt denies polydipsia, polyuria, or low sugar symptoms. Pt states overall good compliance with treatment and medications, good tolerability, and has been trying to follow appropriate diet.  Pt denies worsening depressive symptoms, suicidal ideation or panic. No fever, night sweats, wt loss, loss of appetite, or other constitutional symptoms.  Pt states good ability with ADL's, has low fall risk, home safety reviewed and adequate, no other significant changes in hearing or vision, and only occasionally active with exercise. No new complaints Wt Readings from Last 3 Encounters:  03/17/18 95 lb (43.1 kg)  03/04/18 93 lb (42.2 kg)  12/13/17 95 lb (43.1 kg)   Past Medical History:  Diagnosis Date  . Anxiety state, unspecified 02/23/2013  . Bilateral hearing loss 02/23/2013  . BPH (benign prostatic hypertrophy)   . Cachexia (HCC) 03/14/2013  . Depression 03/14/2013  . Diverticulitis   . Hyperlipidemia 12/31/2015  . Impaired glucose tolerance 03/14/2013  . Insomnia 03/14/2013  . Irritable bowel 03/14/2013   Past Surgical History:  Procedure Laterality Date  . PROSTATE SURGERY    . SHOULDER SURGERY      reports that he has quit smoking. He has never used smokeless tobacco. He reports that he drinks alcohol. He reports that he does not use drugs. family history is not on file. No Known Allergies Current Outpatient Medications on File Prior to Visit  Medication Sig Dispense Refill  . Cholecalciferol (VITAMIN D) 2000 UNITS CAPS Take 1 capsule by mouth daily.    Marland Kitchen galantamine (RAZADYNE ER) 16 MG 24 hr capsule Take 1 capsule  (16 mg total) by mouth daily with breakfast. 90 capsule 3  . Lutein 20 MG TABS Take by mouth daily.    . mirtazapine (REMERON) 15 MG tablet Take 1 tablet (15 mg total) by mouth at bedtime. 90 tablet 3  . Multiple Vitamin (MULTIVITAMIN) tablet Take 1 tablet by mouth daily.     No current facility-administered medications on file prior to visit.    Review of Systems Constitutional: Negative for other unusual diaphoresis, sweats, appetite or weight changes HENT: Negative for other worsening hearing loss, ear pain, facial swelling, mouth sores or neck stiffness.   Eyes: Negative for other worsening pain, redness or other visual disturbance.  Respiratory: Negative for other stridor or swelling Cardiovascular: Negative for other palpitations or other chest pain  Gastrointestinal: Negative for worsening diarrhea or loose stools, blood in stool, distention or other pain Genitourinary: Negative for hematuria, flank pain or other change in urine volume.  Musculoskeletal: Negative for myalgias or other joint swelling.  Skin: Negative for other color change, or other wound or worsening drainage.  Neurological: Negative for other syncope or numbness. Hematological: Negative for other adenopathy or swelling Psychiatric/Behavioral: Negative for hallucinations, other worsening agitation, SI, self-injury, or new decreased concentration All other exam findings    Objective:   Physical Exam BP 116/62   Pulse 65   Temp 97.8 F (36.6 C) (Oral)   Ht 5\' 3"  (1.6 m)   Wt 95 lb (43.1 kg)   SpO2 97%   BMI 16.83 kg/m  VS noted,  Constitutional: Pt is oriented to person, place, and time. Appears well-developed and well-nourished, in no significant distress and comfortable Head: Normocephalic and atraumatic  Eyes: Conjunctivae and EOM are normal. Pupils are equal, round, and reactive to light Right Ear: External ear normal without discharge Left Ear: External ear normal without discharge Nose: Nose without  discharge or deformity Mouth/Throat: Oropharynx is without other ulcerations and moist  Neck: Normal range of motion. Neck supple. No JVD present. No tracheal deviation present or significant neck LA or mass Cardiovascular: Normal rate, regular rhythm, normal heart sounds and intact distal pulses.   Pulmonary/Chest: WOB normal and breath sounds without rales or wheezing  Abdominal: Soft. Bowel sounds are normal. NT. No HSM  Musculoskeletal: Normal range of motion. Exhibits no edema Lymphadenopathy: Has no other cervical adenopathy.  Neurological: Pt is alert and oriented to person, place, and time. Pt has normal reflexes. No cranial nerve deficit. Motor grossly intact, Gait intact Skin: Skin is warm and dry. No rash noted or new ulcerations Psychiatric:  Has normal mood and affect. Behavior is normal without agitation No other exam findings       Assessment & Plan:

## 2018-03-20 ENCOUNTER — Encounter: Payer: Self-pay | Admitting: Neurology

## 2018-03-29 ENCOUNTER — Other Ambulatory Visit (INDEPENDENT_AMBULATORY_CARE_PROVIDER_SITE_OTHER): Payer: Medicare Other

## 2018-03-29 ENCOUNTER — Ambulatory Visit: Payer: Medicare Other | Admitting: Internal Medicine

## 2018-03-29 ENCOUNTER — Encounter: Payer: Self-pay | Admitting: Internal Medicine

## 2018-03-29 VITALS — BP 102/64 | HR 60 | Temp 98.2°F | Ht 63.0 in | Wt 90.0 lb

## 2018-03-29 DIAGNOSIS — R3912 Poor urinary stream: Secondary | ICD-10-CM

## 2018-03-29 DIAGNOSIS — R7302 Impaired glucose tolerance (oral): Secondary | ICD-10-CM

## 2018-03-29 DIAGNOSIS — N401 Enlarged prostate with lower urinary tract symptoms: Secondary | ICD-10-CM

## 2018-03-29 DIAGNOSIS — E785 Hyperlipidemia, unspecified: Secondary | ICD-10-CM | POA: Diagnosis not present

## 2018-03-29 LAB — URINALYSIS, ROUTINE W REFLEX MICROSCOPIC
BILIRUBIN URINE: NEGATIVE
HGB URINE DIPSTICK: NEGATIVE
KETONES UR: NEGATIVE
LEUKOCYTES UA: NEGATIVE
NITRITE: NEGATIVE
PH: 7 (ref 5.0–8.0)
RBC / HPF: NONE SEEN (ref 0–?)
Specific Gravity, Urine: 1.02 (ref 1.000–1.030)
UROBILINOGEN UA: 0.2 (ref 0.0–1.0)
Urine Glucose: NEGATIVE

## 2018-03-29 MED ORDER — DUTASTERIDE 0.5 MG PO CAPS: 0.5000 mg | ORAL_CAPSULE | Freq: Every day | ORAL | 3 refills | Status: DC

## 2018-03-29 MED ORDER — TAMSULOSIN HCL 0.4 MG PO CAPS: 0.4000 mg | ORAL_CAPSULE | Freq: Every day | ORAL | 3 refills | Status: AC

## 2018-03-29 NOTE — Progress Notes (Signed)
Subjective:    Patient ID: Jacob Mason, male    DOB: 07/15/1929, 84 y.o.   MRN: [id number]  HPI  Here with wife to c/o 1- wks unusually mild to mod weak urinary stream without other s/s, similar to when he had TURP about 20 yrs ago, no recent med changes or caffeine use;  Denies urinary symptoms such as dysuria, frequency, urgency, flank pain, hematuria or n/v, fever, chills. Pt denies chest pain, increased sob or doe, wheezing, orthopnea, PND, increased LE swelling, palpitations, dizziness or syncope.   Pt denies polydipsia, polyuria Past Medical History:  Diagnosis Date  . Anxiety state, unspecified 02/23/2013  . Bilateral hearing loss 02/23/2013  . BPH (benign prostatic hypertrophy)   . Cachexia (HCC) 03/14/2013  . Depression 03/14/2013  . Diverticulitis   . Hyperlipidemia 12/31/2015  . Impaired glucose tolerance 03/14/2013  . Insomnia 03/14/2013  . Irritable bowel 03/14/2013   Past Surgical History:  Procedure Laterality Date  . PROSTATE SURGERY    . SHOULDER SURGERY      reports that he has quit smoking. He has never used smokeless tobacco. He reports that he drinks alcohol. He reports that he does not use drugs. family history is not on file. No Known Allergies Current Outpatient Medications on File Prior to Visit  Medication Sig Dispense Refill  . Cholecalciferol (VITAMIN D) 2000 UNITS CAPS Take 1 capsule by mouth daily.    Marland Kitchen. galantamine (RAZADYNE ER) 16 MG 24 hr capsule Take 1 capsule (16 mg total) by mouth daily with breakfast. 90 capsule 3  . Lutein 20 MG TABS Take by mouth daily.    . mirtazapine (REMERON) 15 MG tablet Take 1 tablet (15 mg total) by mouth at bedtime. 90 tablet 3  . Multiple Vitamin (MULTIVITAMIN) tablet Take 1 tablet by mouth daily.     No current facility-administered medications on file prior to visit.    Review of Systems  Constitutional: Negative for other unusual diaphoresis or sweats HENT: Negative for ear discharge or swelling Eyes: Negative for other  worsening visual disturbances Respiratory: Negative for stridor or other swelling  Gastrointestinal: Negative for worsening distension or other blood Genitourinary: Negative for retention or other urinary change Musculoskeletal: Negative for other MSK pain or swelling Skin: Negative for color change or other new lesions Neurological: Negative for worsening tremors and other numbness  Psychiatric/Behavioral: Negative for worsening agitation or other fatigue All other system neg per pt    Objective:   Physical Exam BP 102/64   Pulse 60   Temp 98.2 F (36.8 C) (Oral)   Ht 5\' 3"  (1.6 m)   Wt 90 lb (40.8 kg)   SpO2 96%   BMI 15.94 kg/m  VS noted,  Constitutional: Pt appears in NAD HENT: Head: NCAT.  Right Ear: External ear normal.  Left Ear: External ear normal.  Eyes: . Pupils are equal, round, and reactive to light. Conjunctivae and EOM are normal Nose: without d/c or deformity Neck: Neck supple. Gross normal ROM Cardiovascular: Normal rate and regular rhythm.   Pulmonary/Chest: Effort normal and breath sounds without rales or wheezing.  Abd:  Soft, NT, ND, + BS, no organomegaly Neurological: Pt is alert. At baseline orientation, motor grossly intact Skin: Skin is warm. No rashes, other new lesions, no LE edema Psychiatric: Pt behavior is normal without agitation  No other exam findings Lab Results  Component Value Date   WBC 4.3 03/17/2018   HGB 12.6 (L) 03/17/2018   HCT 38.0 (L) 03/17/2018  PLT 211.0 03/17/2018   GLUCOSE 154 (H) 03/17/2018   CHOL 192 03/17/2018   TRIG 66.0 03/17/2018   HDL 84.50 03/17/2018   LDLCALC 94 03/17/2018   ALT 14 03/17/2018   AST 21 03/17/2018   NA 139 03/17/2018   K 4.1 03/17/2018   CL 101 03/17/2018   CREATININE 0.84 03/17/2018   BUN 20 03/17/2018   CO2 32 03/17/2018   TSH 0.42 03/17/2018   PSA 0.72 02/23/2013   HGBA1C 6.4 03/17/2018       Assessment & Plan:

## 2018-03-29 NOTE — Assessment & Plan Note (Signed)
stable overall by history and exam, recent data reviewed with pt, and pt to continue medical treatment as before,  to f/u any worsening symptoms or concerns Lab Results  Component Value Date   HGBA1C 6.4 03/17/2018

## 2018-03-29 NOTE — Assessment & Plan Note (Signed)
Lab Results  Component Value Date   LDLCALC 94 03/17/2018  stable overall by history and exam, recent data reviewed with pt, and pt to continue medical treatment as before,  to f/u any worsening symptoms or concerns

## 2018-03-29 NOTE — Patient Instructions (Signed)
Please take all new medication as prescribed - the flomax (which works faster), and the Avadart (for later improvement)  Please continue all other medications as before, and refills have been done if requested.  Please have the pharmacy call with any other refills you may need..  Please keep your appointments with your specialists as you may have planned  Please go to the LAB in the Basement (turn left off the elevator) for the tests to be done today  You will be contacted by phone if any changes need to be made immediately.  Otherwise, you will receive a letter about your results with an explanation, but please check with MyChart first.  Please remember to sign up for MyChart if you have not done so, as this will be important to you in the future with finding out test results, communicating by private email, and scheduling acute appointments online when needed.

## 2018-03-29 NOTE — Assessment & Plan Note (Signed)
With weak urinary stream, for ua and culture, but suspect symptoms BPH related, for flomax and avodart, consider urology if not improved

## 2018-03-30 LAB — URINE CULTURE
MICRO NUMBER: 90990480
RESULT: NO GROWTH
SPECIMEN QUALITY: ADEQUATE

## 2018-05-23 ENCOUNTER — Encounter (HOSPITAL_COMMUNITY): Payer: Self-pay | Admitting: Emergency Medicine

## 2018-05-23 ENCOUNTER — Telehealth: Payer: Self-pay | Admitting: Internal Medicine

## 2018-05-23 ENCOUNTER — Emergency Department (HOSPITAL_COMMUNITY)
Admission: EM | Admit: 2018-05-23 | Discharge: 2018-05-23 | Disposition: A | Discharge status: Home / Self Care | Payer: Medicare Other | Attending: Emergency Medicine | Admitting: Emergency Medicine

## 2018-05-23 ENCOUNTER — Ambulatory Visit: Payer: Self-pay | Admitting: *Deleted

## 2018-05-23 DIAGNOSIS — Z87891 Personal history of nicotine dependence: Secondary | ICD-10-CM | POA: Diagnosis not present

## 2018-05-23 DIAGNOSIS — R339 Retention of urine, unspecified: Secondary | ICD-10-CM

## 2018-05-23 DIAGNOSIS — Z79899 Other long term (current) drug therapy: Secondary | ICD-10-CM | POA: Diagnosis not present

## 2018-05-23 DIAGNOSIS — N400 Enlarged prostate without lower urinary tract symptoms: Secondary | ICD-10-CM | POA: Diagnosis not present

## 2018-05-23 LAB — BASIC METABOLIC PANEL
ANION GAP: 6 (ref 5–15)
BUN: 21 mg/dL (ref 8–23)
CHLORIDE: 104 mmol/L (ref 98–111)
CO2: 29 mmol/L (ref 22–32)
CREATININE: 0.76 mg/dL (ref 0.61–1.24)
Calcium: 9.5 mg/dL (ref 8.9–10.3)
GFR calc non Af Amer: >60 mL/min (ref >60)
Glucose, Bld: 116 mg/dL — ABNORMAL HIGH (ref 70–99)
POTASSIUM: 4 mmol/L (ref 3.5–5.1)
SODIUM: 139 mmol/L (ref 135–145)

## 2018-05-23 LAB — CBC WITH DIFFERENTIAL/PLATELET
ABS IMMATURE GRANULOCYTES: 0.01 10*3/uL (ref 0.00–0.07)
BASOS ABS: 0 10*3/uL (ref 0.0–0.1)
Basophils Relative: 0 %
EOS PCT: 1 %
Eosinophils Absolute: 0 10*3/uL (ref 0.0–0.5)
HEMATOCRIT: 40.4 % (ref 39.0–52.0)
HEMOGLOBIN: 12.9 g/dL — AB (ref 13.0–17.0)
Immature Granulocytes: 0 %
LYMPHS PCT: 23 %
Lymphs Abs: 1 10*3/uL (ref 0.7–4.0)
MCH: 31.9 pg (ref 26.0–34.0)
MCHC: 31.9 g/dL (ref 30.0–36.0)
MCV: 99.8 fL (ref 80.0–100.0)
Monocytes Absolute: 0.4 10*3/uL (ref 0.1–1.0)
Monocytes Relative: 10 %
NEUTROS ABS: 3 10*3/uL (ref 1.7–7.7)
NRBC: 0 % (ref 0.0–0.2)
Neutrophils Relative %: 66 %
Platelets: 180 10*3/uL (ref 150–400)
RBC: 4.05 MIL/uL — AB (ref 4.22–5.81)
RDW: 14 % (ref 11.5–15.5)
WBC: 4.5 10*3/uL (ref 4.0–10.5)

## 2018-05-23 LAB — URINALYSIS, ROUTINE W REFLEX MICROSCOPIC
Bilirubin Urine: NEGATIVE
GLUCOSE, UA: NEGATIVE mg/dL
Hgb urine dipstick: NEGATIVE
Ketones, ur: 5 mg/dL — AB
LEUKOCYTES UA: NEGATIVE
NITRITE: NEGATIVE
PH: 5 (ref 5.0–8.0)
PROTEIN: NEGATIVE mg/dL
Specific Gravity, Urine: 1.021 (ref 1.005–1.030)

## 2018-05-23 MED ORDER — DUTASTERIDE 0.5 MG PO CAPS: 0.5000 mg | ORAL_CAPSULE | Freq: Every day | ORAL | 3 refills | Status: DC

## 2018-05-23 NOTE — Telephone Encounter (Signed)
Ok, this is done 

## 2018-05-23 NOTE — ED Provider Notes (Signed)
[facility] EMERGENCY DEPARTMENT Provider Note   CSN: [id number] Arrival date & time: 05/23/18  0243     History   Chief Complaint Chief Complaint  Patient presents with  . Urinary Retention    HPI Jacob Mason is a 84 y.o. male.  Patient presents to the ED with a chief complaint of urinary retention.  He states that he has been unable to urinate since yesterday afternoon.  He reports some abdominal pressure.  Denies fevers or chills.  Denies nausea, vomiting, diarrhea.  Denies any dysuria or hematuria.  Denies any other associated symptoms.  He reports history of BPH and takes Flomax.  The history is provided by the patient. No language interpreter was used.    Past Medical History:  Diagnosis Date  . Anxiety state, unspecified 02/23/2013  . Bilateral hearing loss 02/23/2013  . BPH (benign prostatic hypertrophy)   . Cachexia (HCC) 03/14/2013  . Depression 03/14/2013  . Diverticulitis   . Hyperlipidemia 12/31/2015  . Impaired glucose tolerance 03/14/2013  . Insomnia 03/14/2013  . Irritable bowel 03/14/2013    Patient Active Problem List   Diagnosis Date Noted  . Mild cognitive impairment 06/14/2017  . Hyperlipidemia 12/31/2015  . Cough 09/26/2014  . Irritable bowel 03/14/2013  . Insomnia 03/14/2013  . Depression 03/14/2013  . Impaired glucose tolerance 03/14/2013  . Cachexia (HCC) 03/14/2013  . Preventative health care 02/23/2013  . Bilateral hearing loss 02/23/2013  . Anxiety state, unspecified 02/23/2013  . Abdominal pain, other specified site 02/23/2013  . Loss of weight 02/23/2013  . Diverticulitis   . Benign prostatic hyperplasia     Past Surgical History:  Procedure Laterality Date  . PROSTATE SURGERY    . SHOULDER SURGERY          Home Medications    Prior to Admission medications   Medication Sig Start Date End Date Taking? Authorizing Provider  Cholecalciferol (VITAMIN D) 2000 UNITS CAPS Take 1 capsule by mouth daily.   Yes [provider]  Lutein 20 MG TABS Take by mouth daily.   Yes [provider]  Multiple Vitamin (MULTIVITAMIN) tablet Take 1 tablet by mouth daily.   Yes [provider]  omega-3 acid ethyl esters (LOVAZA) 1 g capsule Take 1 g by mouth daily.   Yes [provider]  Probiotic Product (PROBIOTIC PO) Take 1 tablet by mouth daily.   Yes [provider]  dutasteride (AVODART) 0.5 MG capsule Take 1 capsule (0.5 mg total) by mouth daily. Patient not taking: Reported on 05/23/2018 03/29/18   Corwin Levins, MD  galantamine (RAZADYNE ER) 16 MG 24 hr capsule Take 1 capsule (16 mg total) by mouth daily with breakfast. Patient not taking: Reported on 05/23/2018 03/04/18   Van Clines, MD  mirtazapine (REMERON) 15 MG tablet Take 1 tablet (15 mg total) by mouth at bedtime. Patient not taking: Reported on 05/23/2018 12/13/17   Corwin Levins, MD  tamsulosin (FLOMAX) 0.4 MG CAPS capsule Take 1 capsule (0.4 mg total) by mouth daily. Patient not taking: Reported on 05/23/2018 03/29/18   Corwin Levins, MD    Family History No family history on file.  Social History Social History   Tobacco Use  . Smoking status: Former Games developer  . Smokeless tobacco: Never Used  Substance Use Topics  . Alcohol use: Yes    Alcohol/week: 0.0 standard drinks    Comment: occ   . Drug use: No     Allergies  Patient has no known allergies.   Review of Systems Review of Systems  All other systems reviewed and are negative.    Physical Exam Updated Vital Signs BP (!) 139/57 (BP Location: Right Arm)   Pulse 65   Temp 97.6 F (36.4 C) (Oral)   Resp 18   Ht 5\' 3"  (1.6 m)   Wt 38.6 kg   SpO2 97%   BMI 15.06 kg/m   Physical Exam  Constitutional: He is oriented to person, place, and time. He appears well-developed and well-nourished.  HENT:  Head: Normocephalic and atraumatic.  Eyes: Pupils are equal, round, and reactive to light. Conjunctivae and EOM are normal. Right eye  exhibits no discharge. Left eye exhibits no discharge. No scleral icterus.  Neck: Normal range of motion. Neck supple. No JVD present.  Cardiovascular: Normal rate, regular rhythm and normal heart sounds. Exam reveals no gallop and no friction rub.  No murmur heard. Pulmonary/Chest: Effort normal and breath sounds normal. No respiratory distress. He has no wheezes. He has no rales. He exhibits no tenderness.  Abdominal: Soft. He exhibits no distension and no mass. There is no tenderness. There is no rebound and no guarding.  Musculoskeletal: Normal range of motion. He exhibits no edema or tenderness.  Neurological: He is alert and oriented to person, place, and time.  Skin: Skin is warm and dry.  Psychiatric: He has a normal mood and affect. His behavior is normal. Judgment and thought content normal.  Nursing note and vitals reviewed.    ED Treatments / Results  Labs (all labs ordered are listed, but only abnormal results are displayed) Labs Reviewed  URINALYSIS, ROUTINE W REFLEX MICROSCOPIC - Abnormal; Notable for the following components:      Result Value   Ketones, ur 5 (*)    All other components within normal limits  CBC WITH DIFFERENTIAL/PLATELET - Abnormal; Notable for the following components:   RBC 4.05 (*)    Hemoglobin 12.9 (*)    All other components within normal limits  BASIC METABOLIC PANEL - Abnormal; Notable for the following components:   Glucose, Bld 116 (*)    All other components within normal limits    EKG None  Radiology No results found.  Procedures Procedures (including critical care time)  Medications Ordered in ED Medications - No data to display   Initial Impression / Assessment and Plan / ED Course  I have reviewed the triage vital signs and the nursing notes.  Pertinent labs & imaging results that were available during my care of the patient were reviewed by me and considered in my medical decision making (see chart for details).      Patient with reported urinary retention.  States that he has not urinated since yesterday.  Patient is able to urinate in the ED.  Bladder scan shows 28 mL.  Urinalysis is negative.  Normal creatinine.  Patient seen by and discussed with Dr. Nicanor Alcon, who agrees with plan for discharge.  Final Clinical Impressions(s) / ED Diagnoses   Final diagnoses:  Prostatic hypertrophy    ED Discharge Orders    None       Roxy Horseman, PA-C 05/23/18 0606    Palumbo, April, MD 05/23/18 [phone]

## 2018-05-23 NOTE — ED Triage Notes (Signed)
Pt reports urinary retention since 1500 yesterday, small dribble @ 2200. Pt has had slower stream x 2 months, has been taking flomax. Pt c/o pressure to lower abdomen.

## 2018-05-23 NOTE — ED Notes (Signed)
BLADDER SCAN REVEALED 21 ML OF URINE  EDPA AWARE

## 2018-05-23 NOTE — Telephone Encounter (Signed)
Copied from CRM [id number]. Topic: Quick Communication - See Telephone Encounter >> May 23, 2018 10:47 AM Arlyss Gandy, NT wrote: CRM for notification. See Telephone encounter for: 05/23/18. [facility] calling to see if the dutasteride (AVODART) 0.5 MG capsule can be resent. States they did not receive this request back on 03/29/18.

## 2018-05-23 NOTE — Telephone Encounter (Signed)
Pt was seen in the ED today and states he is having problems with urination and feels like he needs surgery. Can a referral for Urology be put in? Please advise. Patient has appointment tomorrow with you but feels it is an emergency.

## 2018-05-23 NOTE — Telephone Encounter (Signed)
FYI

## 2018-05-23 NOTE — Telephone Encounter (Signed)
He called in c/o not being able to urinate this morning and his bladder feels full.  He is requesting to have his bladder drained.   He went to [facility] ED last night for urinary retention however he said he was able to finally urinate and it was a lot.   He had not urinated since yesterday afternoon when he went to the ED.   They did not end up putting in a tube to drain his bladder per pt while in the ED.  I let him know he needed to return to the ED to have his bladder drained with a tube because that is not done in the office.    He wanted to come see Dr. Jonny Ruiz now and have his bladder drained I let him know this procedure has to be done in the ED.    He verbalized understanding and is going to return to [facility] ED now.  I routed a note to Dr. Raphael Gibney nurse pool so he would be aware.     Reason for Disposition . [1] Unable to urinate (or only a few drops) > 4 hours AND     [2] bladder feels very full (e.g., palpable bladder or strong urge to urinate)  Answer Assessment - Initial Assessment Questions 1. SYMPTOM: "What's the main symptom you're concerned about?" (e.g., frequency, incontinence)     I can't urinate since yesterday.   I went to the ED at [facility] about 2:00 am last night.   I was able to pee a lot then.   Now this morning I can't urinate again. 2. ONSET: "When did the  retention  start?"     Yesterday but I urinated in the ED last night. 3. PAIN: "Is there any pain?" If so, ask: "How bad is it?" (Scale: 1-10; mild, moderate, severe)     Bladder is feeling full. 4. CAUSE: "What do you think is causing the symptoms?"     Not asked.   Instructed he needed to return to the ED in order to have his bladder drained.   That can't be done in the office. 5. OTHER SYMPTOMS: "Do you have any other symptoms?" (e.g., fever, flank pain, blood in urine, pain with urination)     Not asked 6. PREGNANCY: "Is there any chance you are pregnant?" "When was your last menstrual period?"  N/A  Protocols used: URINARY [facility]

## 2018-05-23 NOTE — Telephone Encounter (Signed)
See below

## 2018-05-23 NOTE — Addendum Note (Signed)
Addended by: Corwin Levins on: 05/23/2018 08:33 PM   Modules accepted: Orders

## 2018-05-24 ENCOUNTER — Ambulatory Visit: Payer: Medicare Other | Admitting: Internal Medicine

## 2018-05-24 NOTE — Telephone Encounter (Signed)
Pt cancelled appt today 4:20 with Dr. Jonny Ruiz because he received a call from Urology this morning and is scheduled for tomorrow - he feels it is better to see urology for this issue instead of PCP - thanks Dr. Jonny Ruiz for referral

## 2018-05-25 DIAGNOSIS — R3914 Feeling of incomplete bladder emptying: Secondary | ICD-10-CM | POA: Diagnosis not present

## 2018-05-28 ENCOUNTER — Encounter (HOSPITAL_COMMUNITY): Payer: Self-pay | Admitting: Emergency Medicine

## 2018-05-28 ENCOUNTER — Emergency Department (HOSPITAL_COMMUNITY)
Admission: EM | Admit: 2018-05-28 | Discharge: 2018-05-28 | Disposition: A | Discharge status: Home / Self Care | Payer: Medicare Other | Attending: Emergency Medicine | Admitting: Emergency Medicine

## 2018-05-28 DIAGNOSIS — Z79899 Other long term (current) drug therapy: Secondary | ICD-10-CM | POA: Diagnosis not present

## 2018-05-28 DIAGNOSIS — R3912 Poor urinary stream: Secondary | ICD-10-CM

## 2018-05-28 DIAGNOSIS — Z87891 Personal history of nicotine dependence: Secondary | ICD-10-CM | POA: Diagnosis not present

## 2018-05-28 DIAGNOSIS — R109 Unspecified abdominal pain: Secondary | ICD-10-CM | POA: Diagnosis present

## 2018-05-28 DIAGNOSIS — N400 Enlarged prostate without lower urinary tract symptoms: Secondary | ICD-10-CM | POA: Diagnosis not present

## 2018-05-28 DIAGNOSIS — N401 Enlarged prostate with lower urinary tract symptoms: Secondary | ICD-10-CM

## 2018-05-28 LAB — CBC
HCT: 38.8 % — ABNORMAL LOW (ref 39.0–52.0)
Hemoglobin: 12.4 g/dL — ABNORMAL LOW (ref 13.0–17.0)
MCH: 31.4 pg (ref 26.0–34.0)
MCHC: 32 g/dL (ref 30.0–36.0)
MCV: 98.2 fL (ref 80.0–100.0)
PLATELETS: 199 10*3/uL (ref 150–400)
RBC: 3.95 MIL/uL — ABNORMAL LOW (ref 4.22–5.81)
RDW: 13.8 % (ref 11.5–15.5)
WBC: 4.7 10*3/uL (ref 4.0–10.5)
nRBC: 0 % (ref 0.0–0.2)

## 2018-05-28 LAB — URINALYSIS, ROUTINE W REFLEX MICROSCOPIC
BILIRUBIN URINE: NEGATIVE
GLUCOSE, UA: 150 mg/dL — AB
Hgb urine dipstick: NEGATIVE
Ketones, ur: NEGATIVE mg/dL
Leukocytes, UA: NEGATIVE
Nitrite: NEGATIVE
Protein, ur: NEGATIVE mg/dL
SPECIFIC GRAVITY, URINE: 1.024 (ref 1.005–1.030)
pH: 5 (ref 5.0–8.0)

## 2018-05-28 LAB — BASIC METABOLIC PANEL
ANION GAP: 8 (ref 5–15)
BUN: 13 mg/dL (ref 8–23)
CO2: 29 mmol/L (ref 22–32)
Calcium: 9.4 mg/dL (ref 8.9–10.3)
Chloride: 102 mmol/L (ref 98–111)
Creatinine, Ser: 0.8 mg/dL (ref 0.61–1.24)
GFR calc Af Amer: >60 mL/min (ref >60)
GLUCOSE: 140 mg/dL — AB (ref 70–99)
Potassium: 3.9 mmol/L (ref 3.5–5.1)
Sodium: 139 mmol/L (ref 135–145)

## 2018-05-28 NOTE — Discharge Instructions (Addendum)
Jacob Mason,   It was a pleasure taking care of you here in the Ed. The bladder scan we did showed that you did not have a lot of urine in your bladder. Also, since you were able to urinate in the Ed, I will encourage that you continue to take the Flomax and Dunasteride.   ~Take Care Dr. Dortha Schwalbe

## 2018-05-28 NOTE — ED Triage Notes (Signed)
Pt presents to ED for assessment of urinary retention x 10 hrs.  States he feels pressure and pain in his lower abdomen.  Bladder scan in triage shows <125ml of urine at this time.  Patient states he was here a few days ago for the same, but was able to urinate after a while and left before being seen.  Patient hx of BPH, with surgery 25 years ago.  Takes Flomax.

## 2018-05-28 NOTE — ED Provider Notes (Signed)
[facility] EMERGENCY DEPARTMENT Provider Note   CSN: [id number] Arrival date & time: 05/28/18  1101     History   Chief Complaint Chief Complaint  Patient presents with  . Abdominal Pain  . Urinary Retention    HPI Jacob Mason is a 84 y.o. male with medical history significant for BPH status post TURP 20 years ago presenting for evaluation of irregular urinary stream.  He reports that about a couple months ago he began noticing weak urinary stream and visited his PCP for which she was started on Flomax.  He had been doing well until last week when he went all night without having urine output. He presented to the ED on 10/14 with urinary retention but however was noted to have an unremarkable UA and BMP. His bladder scan at that visit was 28mL. He was also seen by his urologist recently and was started on Dunasteride.  He reports that ultrasound performed at the urologist office did not reveal worsening BPH.  Today, he presents stating that yesterday night he was not able to have a urine output was reports of a sensation of suprapubic pressure.  He denies dysuria, urinary frequency, urinary urgency, dribbling, continence with cough, sneezing, fevers, chills, nausea, vomiting, back pain   Past Medical History:  Diagnosis Date  . Anxiety state, unspecified 02/23/2013  . Bilateral hearing loss 02/23/2013  . BPH (benign prostatic hypertrophy)   . Cachexia (HCC) 03/14/2013  . Depression 03/14/2013  . Diverticulitis   . Hyperlipidemia 12/31/2015  . Impaired glucose tolerance 03/14/2013  . Insomnia 03/14/2013  . Irritable bowel 03/14/2013    Patient Active Problem List   Diagnosis Date Noted  . Mild cognitive impairment 06/14/2017  . Hyperlipidemia 12/31/2015  . Cough 09/26/2014  . Irritable bowel 03/14/2013  . Insomnia 03/14/2013  . Depression 03/14/2013  . Impaired glucose tolerance 03/14/2013  . Cachexia (HCC) 03/14/2013  . Preventative health care 02/23/2013  .  Bilateral hearing loss 02/23/2013  . Anxiety state, unspecified 02/23/2013  . Abdominal pain, other specified site 02/23/2013  . Loss of weight 02/23/2013  . Diverticulitis   . Benign prostatic hyperplasia     Past Surgical History:  Procedure Laterality Date  . PROSTATE SURGERY    . SHOULDER SURGERY          Home Medications    Prior to Admission medications   Medication Sig Start Date End Date Taking? Authorizing Provider  Cholecalciferol (VITAMIN D) 2000 UNITS CAPS Take 1 capsule by mouth daily.    [provider]  dutasteride (AVODART) 0.5 MG capsule Take 1 capsule (0.5 mg total) by mouth daily. 05/23/18   Corwin Levins, MD  galantamine (RAZADYNE ER) 16 MG 24 hr capsule Take 1 capsule (16 mg total) by mouth daily with breakfast. Patient not taking: Reported on 05/23/2018 03/04/18   Van Clines, MD  Lutein 20 MG TABS Take by mouth daily.    [provider]  mirtazapine (REMERON) 15 MG tablet Take 1 tablet (15 mg total) by mouth at bedtime. Patient not taking: Reported on 05/23/2018 12/13/17   Corwin Levins, MD  Multiple Vitamin (MULTIVITAMIN) tablet Take 1 tablet by mouth daily.    [provider]  omega-3 acid ethyl esters (LOVAZA) 1 g capsule Take 1 g by mouth daily.    [provider]  Probiotic Product (PROBIOTIC PO) Take 1 tablet by mouth daily.    [provider]  tamsulosin (FLOMAX) 0.4 MG CAPS capsule Take  1 capsule (0.4 mg total) by mouth daily. Patient not taking: Reported on 05/23/2018 03/29/18   Corwin Levins, MD    Family History History reviewed. No pertinent family history.  Social History Social History   Tobacco Use  . Smoking status: Former Games developer  . Smokeless tobacco: Never Used  Substance Use Topics  . Alcohol use: Yes    Alcohol/week: 0.0 standard drinks    Comment: occ   . Drug use: No     Allergies   Patient has no known allergies.   Review of Systems Review of Systems  Constitutional:  Negative.   HENT: Negative.   Eyes: Negative.   Respiratory: Negative.   Cardiovascular: Negative.   Genitourinary: Positive for decreased urine volume and difficulty urinating. Negative for dysuria, flank pain, frequency and urgency.  Musculoskeletal: Negative.   Skin: Negative.   Neurological: Negative.   Psychiatric/Behavioral: Negative.      Physical Exam Updated Vital Signs BP 130/60   Pulse 64   Temp 98.9 F (37.2 C) (Oral)   Resp 18   SpO2 97%   Physical Exam  Constitutional: He appears well-developed and well-nourished.  Non-toxic appearance. He does not appear ill.  HENT:  Head: Normocephalic and atraumatic.  Cardiovascular: Normal rate and regular rhythm. Exam reveals no friction rub.  No murmur heard. Pulmonary/Chest: Effort normal and breath sounds normal.  Abdominal: Normal appearance and bowel sounds are normal.  Neurological: He is alert.  Skin: Skin is warm.  Psychiatric: He has a normal mood and affect. His behavior is normal.  Vitals reviewed.    ED Treatments / Results  Labs (all labs ordered are listed, but only abnormal results are displayed) Labs Reviewed  URINALYSIS, ROUTINE W REFLEX MICROSCOPIC - Abnormal; Notable for the following components:      Result Value   APPearance HAZY (*)    Glucose, UA 150 (*)    All other components within normal limits  CBC - Abnormal; Notable for the following components:   RBC 3.95 (*)    Hemoglobin 12.4 (*)    HCT 38.8 (*)    All other components within normal limits  BASIC METABOLIC PANEL - Abnormal; Notable for the following components:   Glucose, Bld 140 (*)    All other components within normal limits    EKG None  Radiology No results found.  Procedures Procedures (including critical care time)  Medications Ordered in ED Medications - No data to display   Initial Impression / Assessment and Plan / ED Course  I have reviewed the triage vital signs and the nursing notes.  Pertinent labs  & imaging results that were available during my care of the patient were reviewed by me and considered in my medical decision making (see chart for details).   84 year old with gentleman with medical history significant for BPH status post TURP and Alzheimer's disease presented with 1 month history of intermittent moderate to weak urinary stream.  He has been evaluated in the ED with no evidence of urinary retention after bladder scan.  He is currently on Flomax and was started on dutasteride on 10/14 by urology.  Bladder ultrasound in the ED today was <128mL and on reevaluation he was able to have significant urine output while in the ED.  He denies any dysuria, urinary frequency, urinary urgency, symptoms of stress or urge incontinence, rectal pain, fevers, chills or abdominal pain.  Patient does take galantamine for Alzheimer's and was advised to follow-up with his PCP and urologist.  Given that he was just recently started on dutasteride it is unlikely for him to experience intermediate symptom relief.   If symptoms worsens, he will benefit from cystoscopy to evaluate any urethral narrowing or strictures.  Final Clinical Impressions(s) / ED Diagnoses   Final diagnoses:  Benign prostatic hyperplasia with weak urinary stream    ED Discharge Orders    None       Yvette Rack, MD 05/28/18 1345    Gwyneth Sprout, MD 05/29/18 1333

## 2018-05-28 NOTE — ED Notes (Signed)
MD at bedside. 

## 2018-06-03 DIAGNOSIS — R3914 Feeling of incomplete bladder emptying: Secondary | ICD-10-CM | POA: Diagnosis not present

## 2018-06-03 DIAGNOSIS — N138 Other obstructive and reflux uropathy: Secondary | ICD-10-CM | POA: Diagnosis not present

## 2018-06-03 DIAGNOSIS — N401 Enlarged prostate with lower urinary tract symptoms: Secondary | ICD-10-CM | POA: Diagnosis not present

## 2018-06-04 ENCOUNTER — Encounter (HOSPITAL_COMMUNITY): Payer: Self-pay | Admitting: Emergency Medicine

## 2018-06-04 ENCOUNTER — Emergency Department (HOSPITAL_COMMUNITY)
Admission: EM | Admit: 2018-06-04 | Discharge: 2018-06-04 | Disposition: A | Discharge status: Home / Self Care | Payer: Medicare Other | Attending: Emergency Medicine | Admitting: Emergency Medicine

## 2018-06-04 ENCOUNTER — Other Ambulatory Visit: Payer: Self-pay

## 2018-06-04 DIAGNOSIS — R339 Retention of urine, unspecified: Secondary | ICD-10-CM | POA: Insufficient documentation

## 2018-06-04 DIAGNOSIS — R338 Other retention of urine: Secondary | ICD-10-CM

## 2018-06-04 DIAGNOSIS — Z87891 Personal history of nicotine dependence: Secondary | ICD-10-CM | POA: Diagnosis not present

## 2018-06-04 DIAGNOSIS — Z79899 Other long term (current) drug therapy: Secondary | ICD-10-CM | POA: Diagnosis not present

## 2018-06-04 NOTE — ED Provider Notes (Signed)
[facility] EMERGENCY DEPARTMENT Provider Note   CSN: [id number] Arrival date & time: 06/04/18  1149   History   Chief Complaint Chief Complaint  Patient presents with  . Constipation  . Urinary Retention    HPI Jacob Mason is a 84 y.o. male past medical history significant for BPH and retention who presents for evaluation of urinary retention.  States he has been having increased difficulty with urination over the past few months.  Has been seen in the emergency department multiple times for urinary retention.  Was seen by [facility] urology yesterday where he had a urinalysis and GU exam.  His urinalysis was negative for infection.  Per note, it seems like his urinary retention is secondary to BPH.  Patient was taught how to perform intermittent catheterization for continued symptoms.  Patient states that he went to try and catheterize himself this morning, however states he got scared and was not able to insert the catheter.  On arrival to the emergency department today patient was able to void "a large amount."  Patient states on my initial presentation that he would like to go home as he feels better.  On initial triage note it seems like he also complained of constipation, however patient adamantly denies this on my evaluation.  Stating "I just have problems urinating."  Fever, chills, nausea, vomiting, abdominal pain, diarrhea, constipation.  History provided by patient and his significant other.  No interpreter was used.  HPI  Past Medical History:  Diagnosis Date  . Anxiety state, unspecified 02/23/2013  . Bilateral hearing loss 02/23/2013  . BPH (benign prostatic hypertrophy)   . Cachexia (HCC) 03/14/2013  . Depression 03/14/2013  . Diverticulitis   . Hyperlipidemia 12/31/2015  . Impaired glucose tolerance 03/14/2013  . Insomnia 03/14/2013  . Irritable bowel 03/14/2013    Patient Active Problem List   Diagnosis Date Noted  . Mild cognitive impairment 06/14/2017    . Hyperlipidemia 12/31/2015  . Cough 09/26/2014  . Irritable bowel 03/14/2013  . Insomnia 03/14/2013  . Depression 03/14/2013  . Impaired glucose tolerance 03/14/2013  . Cachexia (HCC) 03/14/2013  . Preventative health care 02/23/2013  . Bilateral hearing loss 02/23/2013  . Anxiety state, unspecified 02/23/2013  . Abdominal pain, other specified site 02/23/2013  . Loss of weight 02/23/2013  . Diverticulitis   . Benign prostatic hyperplasia     Past Surgical History:  Procedure Laterality Date  . PROSTATE SURGERY    . SHOULDER SURGERY          Home Medications    Prior to Admission medications   Medication Sig Start Date End Date Taking? Authorizing Provider  Cholecalciferol (VITAMIN D) 2000 UNITS CAPS Take 1 capsule by mouth daily.    [provider]  dutasteride (AVODART) 0.5 MG capsule Take 1 capsule (0.5 mg total) by mouth daily. 05/23/18   Corwin Levins, MD  galantamine (RAZADYNE ER) 16 MG 24 hr capsule Take 1 capsule (16 mg total) by mouth daily with breakfast. Patient not taking: Reported on 05/23/2018 03/04/18   Van Clines, MD  Lutein 20 MG TABS Take by mouth daily.    [provider]  mirtazapine (REMERON) 15 MG tablet Take 1 tablet (15 mg total) by mouth at bedtime. Patient not taking: Reported on 05/23/2018 12/13/17   Corwin Levins, MD  Multiple Vitamin (MULTIVITAMIN) tablet Take 1 tablet by mouth daily.    [provider]  omega-3 acid ethyl esters (LOVAZA) 1 g capsule  Take 1 g by mouth daily.    [provider]  Probiotic Product (PROBIOTIC PO) Take 1 tablet by mouth daily.    [provider]  tamsulosin (FLOMAX) 0.4 MG CAPS capsule Take 1 capsule (0.4 mg total) by mouth daily. Patient not taking: Reported on 05/23/2018 03/29/18   Corwin Levins, MD    Family History No family history on file.  Social History Social History   Tobacco Use  . Smoking status: Former Games developer  . Smokeless tobacco: Never Used   Substance Use Topics  . Alcohol use: Yes    Alcohol/week: 0.0 standard drinks    Comment: occ   . Drug use: No     Allergies   Patient has no known allergies.   Review of Systems Review of Systems  Constitutional: Negative.   Respiratory: Negative.   Cardiovascular: Negative.   Gastrointestinal: Negative.   Genitourinary: Positive for decreased urine volume and difficulty urinating. Negative for discharge, dysuria, enuresis, flank pain, frequency, genital sores, hematuria, penile pain, penile swelling, scrotal swelling, testicular pain and urgency.  Musculoskeletal: Negative.   Skin: Negative.      Physical Exam Updated Vital Signs BP (!) 142/71 (BP Location: Right Arm)   Pulse 84   Temp 98.2 F (36.8 C) (Oral)   Resp 16   Ht 5\' 3"  (1.6 m)   Wt 38.5 kg   SpO2 98%   BMI 15.04 kg/m   Physical Exam  Constitutional: He appears well-developed and well-nourished. No distress.  HENT:  Head: Atraumatic.  Mouth/Throat: Oropharynx is clear and moist.  Eyes: Pupils are equal, round, and reactive to light.  Neck: Normal range of motion. Neck supple.  Cardiovascular: Normal rate, regular rhythm, normal heart sounds and intact distal pulses. Exam reveals no gallop and no friction rub.  No murmur heard. Pulmonary/Chest: Effort normal and breath sounds normal. No stridor. No respiratory distress. He has no wheezes. He has no rales. He exhibits no tenderness.  Abdominal: Soft. Bowel sounds are normal. He exhibits no distension and no mass. There is no tenderness. There is no rebound and no guarding. No hernia.  Genitourinary:  Genitourinary Comments: Patient refuses GU exam.  Musculoskeletal: Normal range of motion.  Neurological: He is alert.  Skin: Skin is warm and dry. He is not diaphoretic.  Psychiatric: He has a normal mood and affect.  Nursing note and vitals reviewed.    ED Treatments / Results  Labs (all labs ordered are listed, but only abnormal results are  displayed) Labs Reviewed - No data to display  EKG None  Radiology No results found.  Procedures Procedures (including critical care time)  Medications Ordered in ED Medications - No data to display   Initial Impression / Assessment and Plan / ED Course  I have reviewed the triage vital signs and the nursing notes.  Pertinent labs & imaging results that were available during my care of the patient were reviewed by me and considered in my medical decision making (see chart for details).  84 year old male who appears otherwise well presents for evaluation of urinary retention.  Afebrile, nonseptic, non-ill-appearing.  No abdominal tenderness. Abdomen without rebound or guarding.  Initial triage note patient complains of constipation, however on my exam patient is adamant that he is not constipated.  Patient states he just has "difficulty urinating." Patient refuses GU exam.  Patient adamant about leaving on my initial exam.  States "I just urinated and I feel fine."  Patient was seen by his [facility] urologist yesterday.  Urinalysis at that time was negative for infection.  Low suspicion for infectious process causing his symptoms at this time.  Symptoms are most likely due to his BPH.  Patient was taught yesterday how to self catheterize for this issue.  Patient was able to void in the emergency department without difficulty.  Patient refuses bladder scan or additional testing at this time and is requesting DC home.  Discussed with patient strict return precautions.  Doubt emergent pathology of his symptoms at this time.  Patient and wife voiced understanding and are agreeable for follow-up.    Patient was discussed with my attending Dr. Hyacinth Meeker who agrees with above treatment, plan and disposition.    Final Clinical Impressions(s) / ED Diagnoses   Final diagnoses:  Acute urinary retention    ED Discharge Orders    None       Danecia Underdown A, PA-C 06/04/18 1308    Eber Hong, MD 06/06/18 1230

## 2018-06-04 NOTE — Discharge Instructions (Addendum)
Seen for evaluation of urinary retention.  You are able to urinate while in the department.  Please help with your urologist if he continues to have difficulty.  Return to the ED if you are not able to void.

## 2018-06-04 NOTE — ED Triage Notes (Signed)
Pt. Stated, I just peed in the lobby, so it might be difficult to get some urine

## 2018-06-04 NOTE — ED Triage Notes (Signed)
Pt. Stated, hard to go to bathroom for my bowels, went to urologist yesterday and gave me a catheter to insert and was not able to.

## 2018-06-07 DIAGNOSIS — N401 Enlarged prostate with lower urinary tract symptoms: Secondary | ICD-10-CM | POA: Diagnosis not present

## 2018-06-07 DIAGNOSIS — N138 Other obstructive and reflux uropathy: Secondary | ICD-10-CM | POA: Diagnosis not present

## 2018-06-11 DIAGNOSIS — Z01818 Encounter for other preprocedural examination: Secondary | ICD-10-CM | POA: Diagnosis not present

## 2018-06-11 DIAGNOSIS — N3090 Cystitis, unspecified without hematuria: Secondary | ICD-10-CM | POA: Diagnosis not present

## 2018-06-11 DIAGNOSIS — I10 Essential (primary) hypertension: Secondary | ICD-10-CM | POA: Diagnosis not present

## 2018-06-11 DIAGNOSIS — N401 Enlarged prostate with lower urinary tract symptoms: Secondary | ICD-10-CM | POA: Diagnosis not present

## 2018-06-14 ENCOUNTER — Ambulatory Visit: Payer: Medicare Other | Admitting: Internal Medicine

## 2018-06-14 ENCOUNTER — Encounter

## 2018-06-14 DIAGNOSIS — I517 Cardiomegaly: Secondary | ICD-10-CM | POA: Diagnosis not present

## 2018-06-15 DIAGNOSIS — N401 Enlarged prostate with lower urinary tract symptoms: Secondary | ICD-10-CM | POA: Diagnosis not present

## 2018-06-15 DIAGNOSIS — R339 Retention of urine, unspecified: Secondary | ICD-10-CM | POA: Diagnosis not present

## 2018-06-16 ENCOUNTER — Encounter: Payer: Self-pay | Admitting: Internal Medicine

## 2018-06-16 ENCOUNTER — Encounter

## 2018-06-16 ENCOUNTER — Other Ambulatory Visit: Payer: Medicare Other

## 2018-06-16 ENCOUNTER — Ambulatory Visit: Payer: Medicare Other | Admitting: Internal Medicine

## 2018-06-16 VITALS — BP 110/62 | HR 65 | Temp 98.6°F | Ht 63.0 in | Wt 96.0 lb

## 2018-06-16 DIAGNOSIS — R7302 Impaired glucose tolerance (oral): Secondary | ICD-10-CM

## 2018-06-16 DIAGNOSIS — R339 Retention of urine, unspecified: Secondary | ICD-10-CM

## 2018-06-16 DIAGNOSIS — F411 Generalized anxiety disorder: Secondary | ICD-10-CM | POA: Diagnosis not present

## 2018-06-16 NOTE — Assessment & Plan Note (Addendum)
Situational worsening, declines ssri trial for now

## 2018-06-16 NOTE — Assessment & Plan Note (Signed)
Lab Results  Component Value Date   HGBA1C 6.4 03/17/2018   stable overall by history and exam, recent data reviewed with pt, and pt to continue medical treatment as before,  to f/u any worsening symptoms or concerns 

## 2018-06-16 NOTE — Assessment & Plan Note (Signed)
With persistent complaints, now s/p TURP, and c/o that he is not much better;  I think anxiety is an element, but does have some evidence for possible mild bladder swelling on PE today; I will check UA and cx, though I suspect may be ok except for hematuria post op, and advised to contact Dr Logan Bores office for f/u possibly sooner than next wk as planned.  He declines bladder scan today with post void residual

## 2018-06-16 NOTE — Progress Notes (Signed)
Subjective:    Patient ID: Jacob Mason, male    DOB: 10/05/1928, 84 y.o.   MRN: [id number]  HPI  Here to f/u with wife; Here after TURP yesterday per Dr Evans/[facility] urology, pt here after he does not feel better.  The main problem is not being able to start urination, better and worse now for several wks.  Has seen ED visit x 5 times.  Taking the cephalexin currently.  Has tried avodart and flomax in past.  Denies urinary symptoms such as dysuria, frequency, urgency, flank pain, hematuria or n/v, fever, chills. Denies worsening depressive symptoms, suicidal ideation, or panic; has ongoing anxiety,with some increase recently, wife remains supportive.  Pt denies chest pain, increased sob or doe, wheezing, orthopnea, PND, increased LE swelling, palpitations, dizziness or syncope. Past Medical History:  Diagnosis Date  . Anxiety state, unspecified 02/23/2013  . Bilateral hearing loss 02/23/2013  . BPH (benign prostatic hypertrophy)   . Cachexia (HCC) 03/14/2013  . Depression 03/14/2013  . Diverticulitis   . Hyperlipidemia 12/31/2015  . Impaired glucose tolerance 03/14/2013  . Insomnia 03/14/2013  . Irritable bowel 03/14/2013   Past Surgical History:  Procedure Laterality Date  . PROSTATE SURGERY    . SHOULDER SURGERY      reports that he has quit smoking. He has never used smokeless tobacco. He reports that he drinks alcohol. He reports that he does not use drugs. family history is not on file. No Known Allergies Current Outpatient Medications on File Prior to Visit  Medication Sig Dispense Refill  . Cholecalciferol (VITAMIN D) 2000 UNITS CAPS Take 1 capsule by mouth daily.    Marland Kitchen dutasteride (AVODART) 0.5 MG capsule Take 1 capsule (0.5 mg total) by mouth daily. 90 capsule 3  . galantamine (RAZADYNE ER) 16 MG 24 hr capsule Take 1 capsule (16 mg total) by mouth daily with breakfast. 90 capsule 3  . Lutein 20 MG TABS Take by mouth daily.    . mirtazapine (REMERON) 15 MG tablet Take 1 tablet (15 mg total)  by mouth at bedtime. 90 tablet 3  . Multiple Vitamin (MULTIVITAMIN) tablet Take 1 tablet by mouth daily.    Marland Kitchen omega-3 acid ethyl esters (LOVAZA) 1 g capsule Take 1 g by mouth daily.    . Probiotic Product (PROBIOTIC PO) Take 1 tablet by mouth daily.    . tamsulosin (FLOMAX) 0.4 MG CAPS capsule Take 1 capsule (0.4 mg total) by mouth daily. 90 capsule 3   No current facility-administered medications on file prior to visit.    Review of Systems  Constitutional: Negative for other unusual diaphoresis or sweats HENT: Negative for ear discharge or swelling Eyes: Negative for other worsening visual disturbances Respiratory: Negative for stridor or other swelling  Gastrointestinal: Negative for worsening distension or other blood Genitourinary: Negative for retention or other urinary change Musculoskeletal: Negative for other MSK pain or swelling Skin: Negative for color change or other new lesions Neurological: Negative for worsening tremors and other numbness  Psychiatric/Behavioral: Negative for worsening agitation or other fatigue All other system neg per pt    Objective:   Physical Exam BP 110/62   Pulse 65   Temp 98.6 F (37 C) (Oral)   Ht 5\' 3"  (1.6 m)   Wt 96 lb (43.5 kg)   SpO2 94%   BMI 17.01 kg/m  VS noted,  Constitutional: Pt appears in NAD HENT: Head: NCAT.  Right Ear: External ear normal.  Left Ear: External ear normal.  Eyes: .  Pupils are equal, round, and reactive to light. Conjunctivae and EOM are normal Nose: without d/c or deformity Neck: Neck supple. Gross normal ROM Cardiovascular: Normal rate and regular rhythm.   Pulmonary/Chest: Effort normal and breath sounds without rales or wheezing.  Abd:  Soft, NT, ND, + BS, no organomegaly Neurological: Pt is alert. At baseline orientation, motor grossly intact Skin: Skin is warm. No rashes, other new lesions, no LE edema Psychiatric: Pt behavior is normal without agitation  No other exam findings  Lab Results    Component Value Date   WBC 4.7 05/28/2018   HGB 12.4 (L) 05/28/2018   HCT 38.8 (L) 05/28/2018   PLT 199 05/28/2018   GLUCOSE 140 (H) 05/28/2018   CHOL 192 03/17/2018   TRIG 66.0 03/17/2018   HDL 84.50 03/17/2018   LDLCALC 94 03/17/2018   ALT 14 03/17/2018   AST 21 03/17/2018   NA 139 05/28/2018   K 3.9 05/28/2018   CL 102 05/28/2018   CREATININE 0.80 05/28/2018   BUN 13 05/28/2018   CO2 29 05/28/2018   TSH 0.42 03/17/2018   PSA 0.72 02/23/2013   HGBA1C 6.4 03/17/2018       Assessment & Plan:

## 2018-06-16 NOTE — Patient Instructions (Addendum)
Please continue all other medications as before, and refills have been done if requested.  Please have the pharmacy call with any other refills you may need.  Please keep your appointments with your specialists as you may have planned  Please go to the LAB in the Basement (turn left off the elevator) for the tests to be done today - just the urine testing today  You will be contacted by phone if any changes need to be made immediately.  Otherwise, you will receive a letter about your results with an explanation, but please check with MyChart first.  Please remember to sign up for MyChart if you have not done so, as this will be important to you in the future with finding out test results, communicating by private email, and scheduling acute appointments online when needed.  Please call Dr Logan Bores office for an earlier appt than next week if you feel you are no better  Please call if you change your mind about some mild medication such as celexa for nerves.

## 2018-06-17 DIAGNOSIS — Z466 Encounter for fitting and adjustment of urinary device: Secondary | ICD-10-CM | POA: Diagnosis not present

## 2018-06-21 ENCOUNTER — Ambulatory Visit: Payer: Medicare Other | Admitting: Internal Medicine

## 2018-06-23 DIAGNOSIS — R339 Retention of urine, unspecified: Secondary | ICD-10-CM | POA: Diagnosis not present

## 2018-06-23 DIAGNOSIS — R1030 Lower abdominal pain, unspecified: Secondary | ICD-10-CM | POA: Diagnosis not present

## 2018-06-23 DIAGNOSIS — N390 Urinary tract infection, site not specified: Secondary | ICD-10-CM | POA: Diagnosis not present

## 2018-06-23 DIAGNOSIS — R319 Hematuria, unspecified: Secondary | ICD-10-CM | POA: Diagnosis not present

## 2018-06-29 DIAGNOSIS — N138 Other obstructive and reflux uropathy: Secondary | ICD-10-CM | POA: Diagnosis not present

## 2018-06-29 DIAGNOSIS — N401 Enlarged prostate with lower urinary tract symptoms: Secondary | ICD-10-CM | POA: Diagnosis not present

## 2018-08-08 DIAGNOSIS — N401 Enlarged prostate with lower urinary tract symptoms: Secondary | ICD-10-CM | POA: Diagnosis not present

## 2018-08-08 DIAGNOSIS — N138 Other obstructive and reflux uropathy: Secondary | ICD-10-CM | POA: Diagnosis not present

## 2018-09-07 ENCOUNTER — Encounter: Payer: Self-pay | Admitting: Nurse Practitioner

## 2018-09-07 ENCOUNTER — Ambulatory Visit: Payer: Medicare Other | Admitting: Nurse Practitioner

## 2018-09-07 VITALS — BP 120/68 | HR 68 | Temp 98.4°F | Ht 63.0 in | Wt 95.0 lb

## 2018-09-07 DIAGNOSIS — J029 Acute pharyngitis, unspecified: Secondary | ICD-10-CM | POA: Diagnosis not present

## 2018-09-07 DIAGNOSIS — J020 Streptococcal pharyngitis: Secondary | ICD-10-CM

## 2018-09-07 LAB — POCT RAPID STREP A (OFFICE): Rapid Strep A Screen: POSITIVE — AB

## 2018-09-07 MED ORDER — PENICILLIN V POTASSIUM 500 MG PO TABS: 500.0000 mg | ORAL_TABLET | Freq: Three times a day (TID) | ORAL | 0 refills | Status: DC

## 2018-09-07 NOTE — Progress Notes (Signed)
Jacob Mason is a 85 y.o. male with the following history as recorded in [employer]:  Patient Active Problem List   Diagnosis Date Noted  . Urinary retention 06/16/2018  . Mild cognitive impairment 06/14/2017  . Hyperlipidemia 12/31/2015  . Cough 09/26/2014  . Irritable bowel 03/14/2013  . Insomnia 03/14/2013  . Depression 03/14/2013  . Impaired glucose tolerance 03/14/2013  . Cachexia (HCC) 03/14/2013  . Preventative health care 02/23/2013  . Bilateral hearing loss 02/23/2013  . Anxiety state 02/23/2013  . Abdominal pain, other specified site 02/23/2013  . Loss of weight 02/23/2013  . Diverticulitis   . Benign prostatic hyperplasia     Current Outpatient Medications  Medication Sig Dispense Refill  . Cholecalciferol (VITAMIN D) 2000 UNITS CAPS Take 1 capsule by mouth daily.    Marland Kitchen dutasteride (AVODART) 0.5 MG capsule Take 1 capsule (0.5 mg total) by mouth daily. 90 capsule 3  . galantamine (RAZADYNE ER) 16 MG 24 hr capsule Take 1 capsule (16 mg total) by mouth daily with breakfast. 90 capsule 3  . Lutein 20 MG TABS Take by mouth daily.    . mirtazapine (REMERON) 15 MG tablet Take 1 tablet (15 mg total) by mouth at bedtime. 90 tablet 3  . Multiple Vitamin (MULTIVITAMIN) tablet Take 1 tablet by mouth daily.    Marland Kitchen omega-3 acid ethyl esters (LOVAZA) 1 g capsule Take 1 g by mouth daily.    . Probiotic Product (PROBIOTIC PO) Take 1 tablet by mouth daily.    . tamsulosin (FLOMAX) 0.4 MG CAPS capsule Take 1 capsule (0.4 mg total) by mouth daily. 90 capsule 3  . penicillin v potassium (VEETID) 500 MG tablet Take 1 tablet (500 mg total) by mouth 3 (three) times daily. 30 tablet 0   No current facility-administered medications for this visit.     Allergies: Patient has no known allergies.  Past Medical History:  Diagnosis Date  . Anxiety state, unspecified 02/23/2013  . Bilateral hearing loss 02/23/2013  . BPH (benign prostatic hypertrophy)   . Cachexia (HCC) 03/14/2013  . Depression 03/14/2013   . Diverticulitis   . Hyperlipidemia 12/31/2015  . Impaired glucose tolerance 03/14/2013  . Insomnia 03/14/2013  . Irritable bowel 03/14/2013    Past Surgical History:  Procedure Laterality Date  . PROSTATE SURGERY    . SHOULDER SURGERY      History reviewed. No pertinent family history.  Social History   Tobacco Use  . Smoking status: Former Games developer  . Smokeless tobacco: Never Used  Substance Use Topics  . Alcohol use: Yes    Alcohol/week: 0.0 standard drinks    Comment: occ      Subjective:  [name] is here today for evaluation of acute complaint of "Scratchy, sore" throat, which began last night. He denies fevers, chills, body aches, cp, cough, sob, abd pain, n/v/d, rash. No trouble swallowing or breathing. Aside from throat pain, no other complaints. Former smoker quit- 30 years ago Recent travel: no Wife was sick with similar symptoms and treated with z-pak earlier this week, she is better now  ROS- See HPI   Objective:  Vitals:   09/07/18 1013  BP: 120/68  Pulse: 68  Temp: 98.4 F (36.9 C)  TempSrc: Oral  SpO2: 97%  Weight: 95 lb (43.1 kg)  Height: 5\' 3"  (1.6 m)    General: Well developed, well nourished, in no acute distress  Skin : Warm and dry.  Head: Normocephalic and atraumatic  Eyes: Sclera and conjunctiva clear; pupils  round and reactive to light; extraocular movements intact  Ears: External normal; canals clear; tympanic membranes normal  Oropharynx: mild posterior oropharyngeal erythema, no edema/exudate. No suspicious lesions  Neck: Supple without thyromegaly, adenopathy  Lungs: Respirations unlabored; clear to auscultation bilaterally without wheeze, rales, rhonchi  CVS exam: normal rate and regular rhythm, S1 and S2 normal.  Extremities: No edema, cyanosis, clubbing  Vessels: Symmetric bilaterally  Neurologic: Alert and oriented; speech intact; face symmetrical; moves all extremities well; CNII-XII intact without focal deficit  Psychiatric: Normal  mood and affect.  Assessment:  1. Sore throat   2. Strep throat     Plan:   Positive POCT strep today Will treat with antibiotic course- medication dosing, side effects discussed Home management, red flags and return precautions including when to seek immediate care discussed and printed on AVS -He will f/u for new, worsening, persistent symptoms  No follow-ups on file.  Orders Placed This Encounter  Procedures  . POCT rapid strep A    Requested Prescriptions   Signed Prescriptions Disp Refills  . penicillin v potassium (VEETID) 500 MG tablet 30 tablet 0    Sig: Take 1 tablet (500 mg total) by mouth 3 (three) times daily.

## 2018-09-07 NOTE — Patient Instructions (Signed)
Complete penicillin as directed  Please follow up for fevers over 101, if your symptoms get worse, or if your symptoms dont improve with the antibiotic.   Strep Throat  Strep throat is an infection of the throat. It is caused by germs. Strep throat spreads from person to person because of coughing, sneezing, or close contact. Follow these instructions at home: Medicines  Take over-the-counter and prescription medicines only as told by your doctor.  Take your antibiotic medicine as told by your doctor. Do not stop taking the medicine even if you feel better.  Have family members who also have a sore throat or fever go to a doctor. Eating and drinking  Do not share food, drinking cups, or personal items.  Try eating soft foods until your sore throat feels better.  Drink enough fluid to keep your pee (urine) clear or pale yellow. General instructions  Rinse your mouth (gargle) with a salt-water mixture 3-4 times per day or as needed. To make a salt-water mixture, stir -1 tsp of salt into 1 cup of warm water.  Make sure that all people in your house wash their hands well.  Rest.  Stay home from school or work until you have been taking antibiotics for 24 hours.  Keep all follow-up visits as told by your doctor. This is important. Contact a doctor if:  Your neck keeps getting bigger.  You get a rash, cough, or earache.  You cough up thick liquid that is green, yellow-brown, or bloody.  You have pain that does not get better with medicine.  Your problems get worse instead of getting better.  You have a fever. Get help right away if:  You throw up (vomit).  You get a very bad headache.  You neck hurts or it feels stiff.  You have chest pain or you are short of breath.  You have drooling, very bad throat pain, or changes in your voice.  Your neck is swollen or the skin gets red and tender.  Your mouth is dry or you are peeing less than normal.  You keep feeling  more tired or it is hard to wake up.  Your joints are red or they hurt. This information is not intended to replace advice given to you by your health care provider. Make sure you discuss any questions you have with your health care provider. Document Released: 01/13/2008 Document Revised: 03/25/2016 Document Reviewed: 11/19/2014 Elsevier Interactive Patient Education  [employer].

## 2018-10-19 DIAGNOSIS — H524 Presbyopia: Secondary | ICD-10-CM | POA: Diagnosis not present

## 2018-10-21 ENCOUNTER — Other Ambulatory Visit: Payer: Self-pay

## 2018-10-21 ENCOUNTER — Ambulatory Visit: Payer: Medicare Other | Admitting: Neurology

## 2018-10-21 ENCOUNTER — Encounter: Payer: Self-pay | Admitting: Neurology

## 2018-10-21 VITALS — BP 110/54 | HR 86 | Temp 98.4°F

## 2018-10-21 DIAGNOSIS — G3184 Mild cognitive impairment, so stated: Secondary | ICD-10-CM | POA: Diagnosis not present

## 2018-10-21 MED ORDER — GALANTAMINE HYDROBROMIDE ER 16 MG PO CP24: 16.0000 mg | ORAL_CAPSULE | Freq: Every day | ORAL | 3 refills | Status: DC

## 2018-10-21 NOTE — Progress Notes (Signed)
NEUROLOGY FOLLOW UP OFFICE NOTE  ASHDON GILLSON [id number] [birth year]  HISTORY OF PRESENT ILLNESS: I had the pleasure of seeing Sigmund Morera in follow-up in the neurology clinic on 10/21/2018.  The patient was last seen on 8 months ago for mild cognitive impairment and is again accompanied by his wife who helps supplement the history today. MMSE 25/30 in July 2019 (MOCA score in January 2019 was 26/30). Galantamine ER dose increased to  daily on his last visit. Since then, he and his wife report that he continues to be forgetful, he keeps asking what day it is. He forgot they were planning to go to [facility] today. He and his wife deny getting lost driving or any driving concerns. Majority of bills are on autopay, they deny any missed bills or medications. He is independent with dressing and bathing. He denies any headaches, dizziness, vision changes, focal numbness/tingling/weakness, no falls. Sleep is good. No personality changes, no paranoia or hallucinations.   History on Initial Assessment: This is a pleasant 85 yo RH man with a history of diet-controlled hyperlipidemia, anxiety, depression, who presented for evaluation of worsening memory. When asked about his memory, he states "she says my memory is kina bad, I agree some." His wife started noticing changes around 3 months ago, he would repeat himself, asking the same questions repeatedly several days in a row. He has difficulty recalling events from a few hours prior. He cannot find important papers at home. He denies getting lost driving, no missed bills or medications. His wife denies any personality changes, no hallucinations. He had stomach trouble on Aricept, currently tolerating Galantamine ER  daily without side effects. He goes to the gym daily. There is no family history of dementia, no history of head injuries, he rarely drinks alcohol. He denies any headaches, dizziness, vision changes, neck/back pain, focal numbness/tingling/weakness,  anosmia, tremors. No falls.   PAST MEDICAL HISTORY: Past Medical History:  Diagnosis Date   Anxiety state, unspecified 02/23/2013   Bilateral hearing loss 02/23/2013   BPH (benign prostatic hypertrophy)    Cachexia (HCC) 03/14/2013   Depression 03/14/2013   Diverticulitis    Hyperlipidemia 12/31/2015   Impaired glucose tolerance 03/14/2013   Insomnia 03/14/2013   Irritable bowel 03/14/2013    MEDICATIONS: Current Outpatient Medications on File Prior to Visit  Medication Sig Dispense Refill   Cholecalciferol (VITAMIN D) 2000 UNITS CAPS Take 1 capsule by mouth daily.     dutasteride (AVODART) 0.5 MG capsule Take 1 capsule (0.5 mg total) by mouth daily. 90 capsule 3   galantamine (RAZADYNE ER) 16 MG 24 hr capsule Take 1 capsule (16 mg total) by mouth daily with breakfast. 90 capsule 3   Lutein 20 MG TABS Take by mouth daily.     mirtazapine (REMERON) 15 MG tablet Take 1 tablet (15 mg total) by mouth at bedtime. 90 tablet 3   Multiple Vitamin (MULTIVITAMIN) tablet Take 1 tablet by mouth daily.     omega-3 acid ethyl esters (LOVAZA) 1 g capsule Take 1 g by mouth daily.     penicillin v potassium (VEETID) 500 MG tablet Take 1 tablet (500 mg total) by mouth 3 (three) times daily. 30 tablet 0   Probiotic Product (PROBIOTIC PO) Take 1 tablet by mouth daily.     tamsulosin (FLOMAX) 0.4 MG CAPS capsule Take 1 capsule (0.4 mg total) by mouth daily. 90 capsule 3   No current facility-administered medications on file prior to visit.     ALLERGIES: No  Known Allergies  FAMILY HISTORY: No family history on file.  SOCIAL HISTORY: Social History   Socioeconomic History   Marital status: Married    Spouse name: Not on file   Number of children: 2   Years of education: PHD   Highest education level: Not on file  Occupational History   Occupation: Retired  Ecologist strain: Not on file   Food insecurity:    Worry: Not on file    Inability: Not on  file   Transportation needs:    Medical: Not on file    Non-medical: Not on file  Tobacco Use   Smoking status: Former Smoker   Smokeless tobacco: Never Used  Substance and Sexual Activity   Alcohol use: Yes    Alcohol/week: 0.0 standard drinks    Comment: occ    Drug use: No   Sexual activity: Not on file  Lifestyle   Physical activity:    Days per week: Not on file    Minutes per session: Not on file   Stress: Not on file  Relationships   Social connections:    Talks on phone: Not on file    Gets together: Not on file    Attends religious service: Not on file    Active member of club or organization: Not on file    Attends meetings of clubs or organizations: Not on file    Relationship status: Not on file   Intimate partner violence:    Fear of current or ex partner: Not on file    Emotionally abused: Not on file    Physically abused: Not on file    Forced sexual activity: Not on file  Other Topics Concern   Not on file  Social History Narrative   Pt lives in 2 story home with his significant other   Has 2 adult children   PhD in microbiology   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     REVIEW OF SYSTEMS: Constitutional: No fevers, chills, or sweats, no generalized fatigue, change in appetite Eyes: No visual changes, double vision, eye pain Ear, nose and throat: No hearing loss, ear pain, nasal congestion, sore throat Cardiovascular: No chest pain, palpitations Respiratory:  No shortness of breath at rest or with exertion, wheezes GastrointestinaI: No nausea, vomiting, diarrhea, abdominal pain, fecal incontinence Genitourinary:  No dysuria, urinary retention or frequency Musculoskeletal:  No neck pain, back pain Integumentary: No rash, pruritus, skin lesions Neurological: as above Psychiatric: No depression, insomnia, anxiety Endocrine: No palpitations, fatigue, diaphoresis, mood swings, change in appetite, change in weight, increased  thirst Hematologic/Lymphatic:  No anemia, purpura, petechiae. Allergic/Immunologic: no itchy/runny eyes, nasal congestion, recent allergic reactions, rashes  PHYSICAL EXAM: Vitals:   10/21/18 1536  BP: (!) 110/54  Pulse: 86  Temp: 98.4 F (36.9 C)   General: No acute distress Head:  Normocephalic/atraumatic Neck: supple, no paraspinal tenderness, full range of motion Heart:  Regular rate and rhythm Lungs:  Clear to auscultation bilaterally Back: No paraspinal tenderness Skin/Extremities: No rash, no edema Neurological Exam: alert and oriented to person, place, and time. No aphasia or dysarthria. Fund of knowledge is appropriate.  Remote memory intact.  Attention and concentration are normal.    Able to name objects and repeat phrases. Montreal Cognitive Assessment  10/21/2018 08/30/2017  Visuospatial/ Executive (0/5) 4 5  Naming (0/3) 1 3  Attention: Read list of digits (0/2) 2 2  Attention: Read list of letters (0/1) 1 1  Attention:  Serial 7 subtraction starting at 100 (0/3) 3 3  Language: Repeat phrase (0/2) 0 2  Language : Fluency (0/1) 1 1  Abstraction (0/2) 0 2  Delayed Recall (0/5) 1 1  Orientation (0/6) 6 6  Total 19 26    MMSE - Mini Mental State Exam 03/04/2018  Orientation to time 4  Orientation to Place 5  Registration 3  Attention/ Calculation 5  Recall 0  Language- name 2 objects 2  Language- repeat 1  Language- follow 3 step command 2  Language- read & follow direction 1  Write a sentence 1  Copy design 1  Total score 25   Cranial nerves: Pupils equal, round, reactive to light. Extraocular movements intact with no nystagmus. Visual fields full. Facial sensation intact. No facial asymmetry. Tongue, uvula, palate midline.  Motor: Bulk and tone normal, muscle strength 5/5 throughout with no pronator drift.  Sensation to light touch intact.  No extinction to double simultaneous stimulation.  Deep tendon reflexes +1 throughout, toes downgoing.  Finger to nose  testing intact.  Gait narrow-based and steady, able to tandem walk adequately.  Romberg negative.  IMPRESSION: This is a pleasant 85 yo RH man with a history of diet-controlled hyperlipidemia, insomnia, anxiety, depression, with mild cognitive impairment. MOCA score today 19/30 (MMSE 25/30 in July 2019, MOCA 26/30 in January 2019). There has been gradual decline, but they continue to report ability to perform complex tasks, consistent with MCI. Continue Galantamine ER 16mg  daily, refils sent. We again discussed the importance of control of vascular risk factors, physical exercise, and brain stimulation exercises. He will follow-up in 6-8 months or so and knows to call for any changes.   Thank you for allowing me to participate in her care.  Please do not hesitate to call for any questions or concerns.  The duration of this appointment visit was 30 minutes of face-to-face time with the patient.  Greater than 50% of this time was spent in counseling, explanation of diagnosis, planning of further management, and coordination of care.   Patrcia Dolly, M.D.   CC: Dr. Jonny Ruiz

## 2018-10-21 NOTE — Patient Instructions (Signed)
1. Continue Galantamine ER 16mg  daily 2. Continue to monitor driving 3. Follow-up in 6-8 months, call for any changes  FALL PRECAUTIONS: Be cautious when walking. Scan the area for obstacles that may increase the risk of trips and falls. When getting up in the mornings, sit up at the edge of the bed for a few minutes before getting out of bed. Consider elevating the bed at the head end to avoid drop of blood pressure when getting up. Walk always in a well-lit room (use night lights in the walls). Avoid area rugs or power cords from appliances in the middle of the walkways. Use a walker or a cane if necessary and consider physical therapy for balance exercise. Get your eyesight checked regularly.  FINANCIAL OVERSIGHT: Supervision, especially oversight when making financial decisions or transactions is also recommended.  HOME SAFETY: Consider the safety of the kitchen when operating appliances like stoves, microwave oven, and blender. Consider having supervision and share cooking responsibilities until no longer able to participate in those. Accidents with firearms and other hazards in the house should be identified and addressed as well.  DRIVING: Regarding driving, in patients with progressive memory problems, driving will be impaired. We advise to have someone else do the driving if trouble finding directions or if minor accidents are reported. Independent driving assessment is available to determine safety of driving.  ABILITY TO BE LEFT ALONE: If patient is unable to contact 911 operator, consider using LifeLine, or when the need is there, arrange for someone to stay with patients. Smoking is a fire hazard, consider supervision or cessation. Risk of wandering should be assessed by caregiver and if detected at any point, supervision and safe proof recommendations should be instituted.  MEDICATION SUPERVISION: Inability to self-administer medication needs to be constantly addressed. Implement a  mechanism to ensure safe administration of the medications.  RECOMMENDATIONS FOR ALL PATIENTS WITH MEMORY PROBLEMS: 1. Continue to exercise (Recommend 30 minutes of walking everyday, or 3 hours every week) 2. Increase social interactions - continue going to [place] and enjoy social gatherings with friends and family 3. Eat healthy, avoid fried foods and eat more fruits and vegetables 4. Maintain adequate blood pressure, blood sugar, and blood cholesterol level. Reducing the risk of stroke and cardiovascular disease also helps promoting better memory. 5. Avoid stressful situations. Live a simple life and avoid aggravations. Organize your time and prepare for the next day in anticipation. 6. Sleep well, avoid any interruptions of sleep and avoid any distractions in the bedroom that may interfere with adequate sleep quality 7. Avoid sugar, avoid sweets as there is a strong link between excessive sugar intake, diabetes, and cognitive impairment The Mediterranean diet has been shown to help patients reduce the risk of progressive memory disorders and reduces cardiovascular risk. This includes eating fish, eat fruits and green leafy vegetables, nuts like almonds and hazelnuts, walnuts, and also use olive oil. Avoid fast foods and fried foods as much as possible. Avoid sweets and sugar as sugar use has been linked to worsening of memory function.

## 2018-11-06 IMAGING — DX DG CHEST 2V
3 series · 3 of 3 positions shown · non-contrast
Comparison: 02/23/2013

CLINICAL DATA: Routine chest x-ray.  Ex-smoker.

EXAM:
CHEST  2 VIEW

[chest pa (1 of 2)]
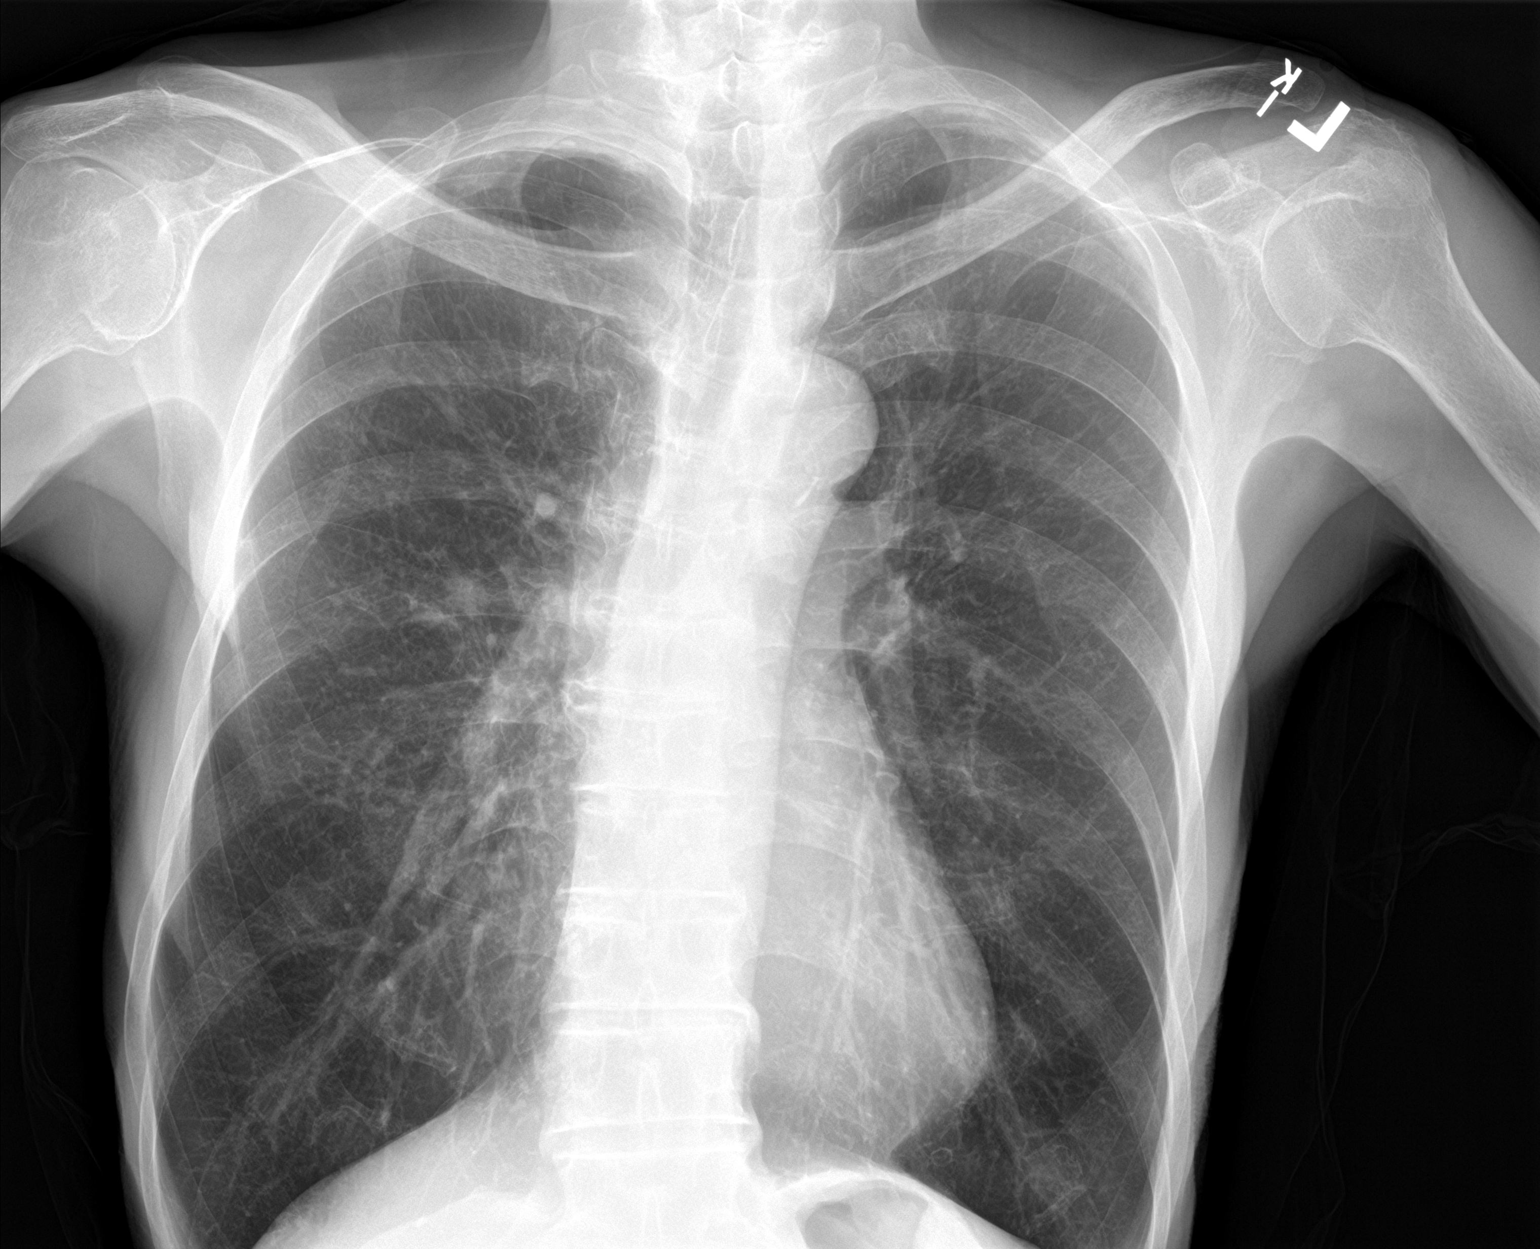

[chest lat]
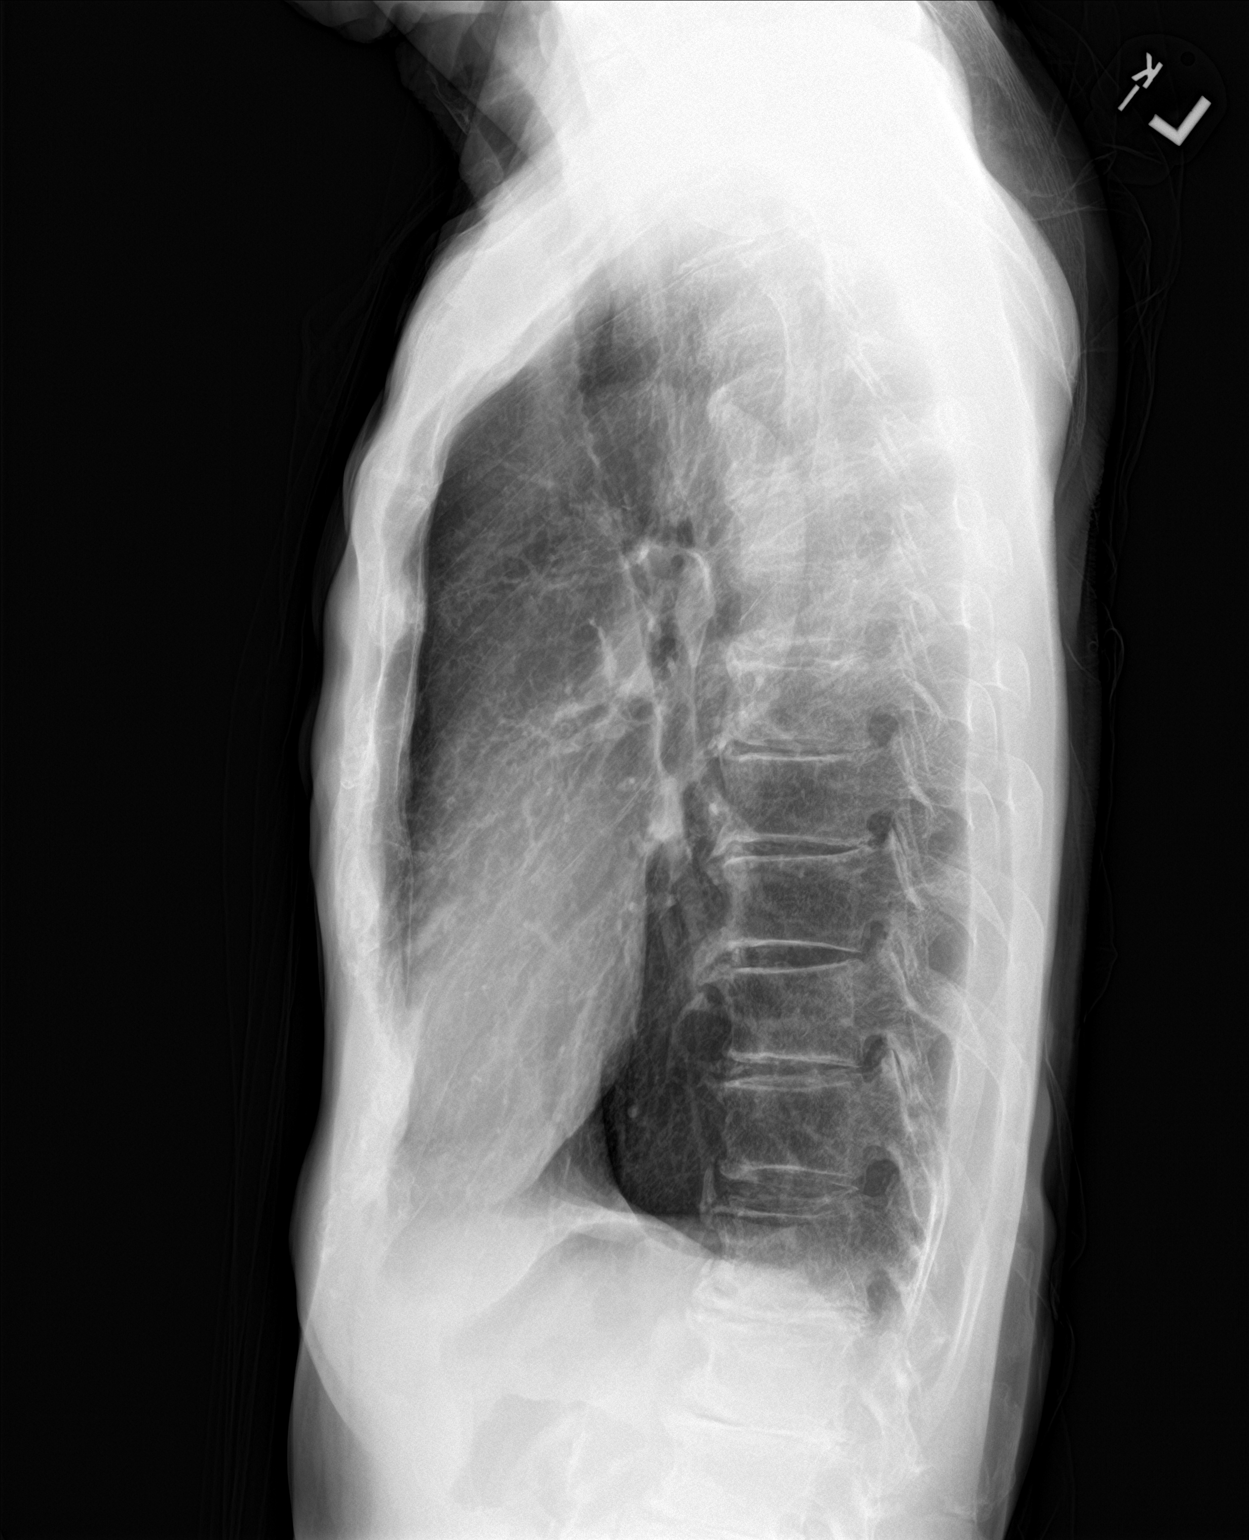

[chest pa (2 of 2)]
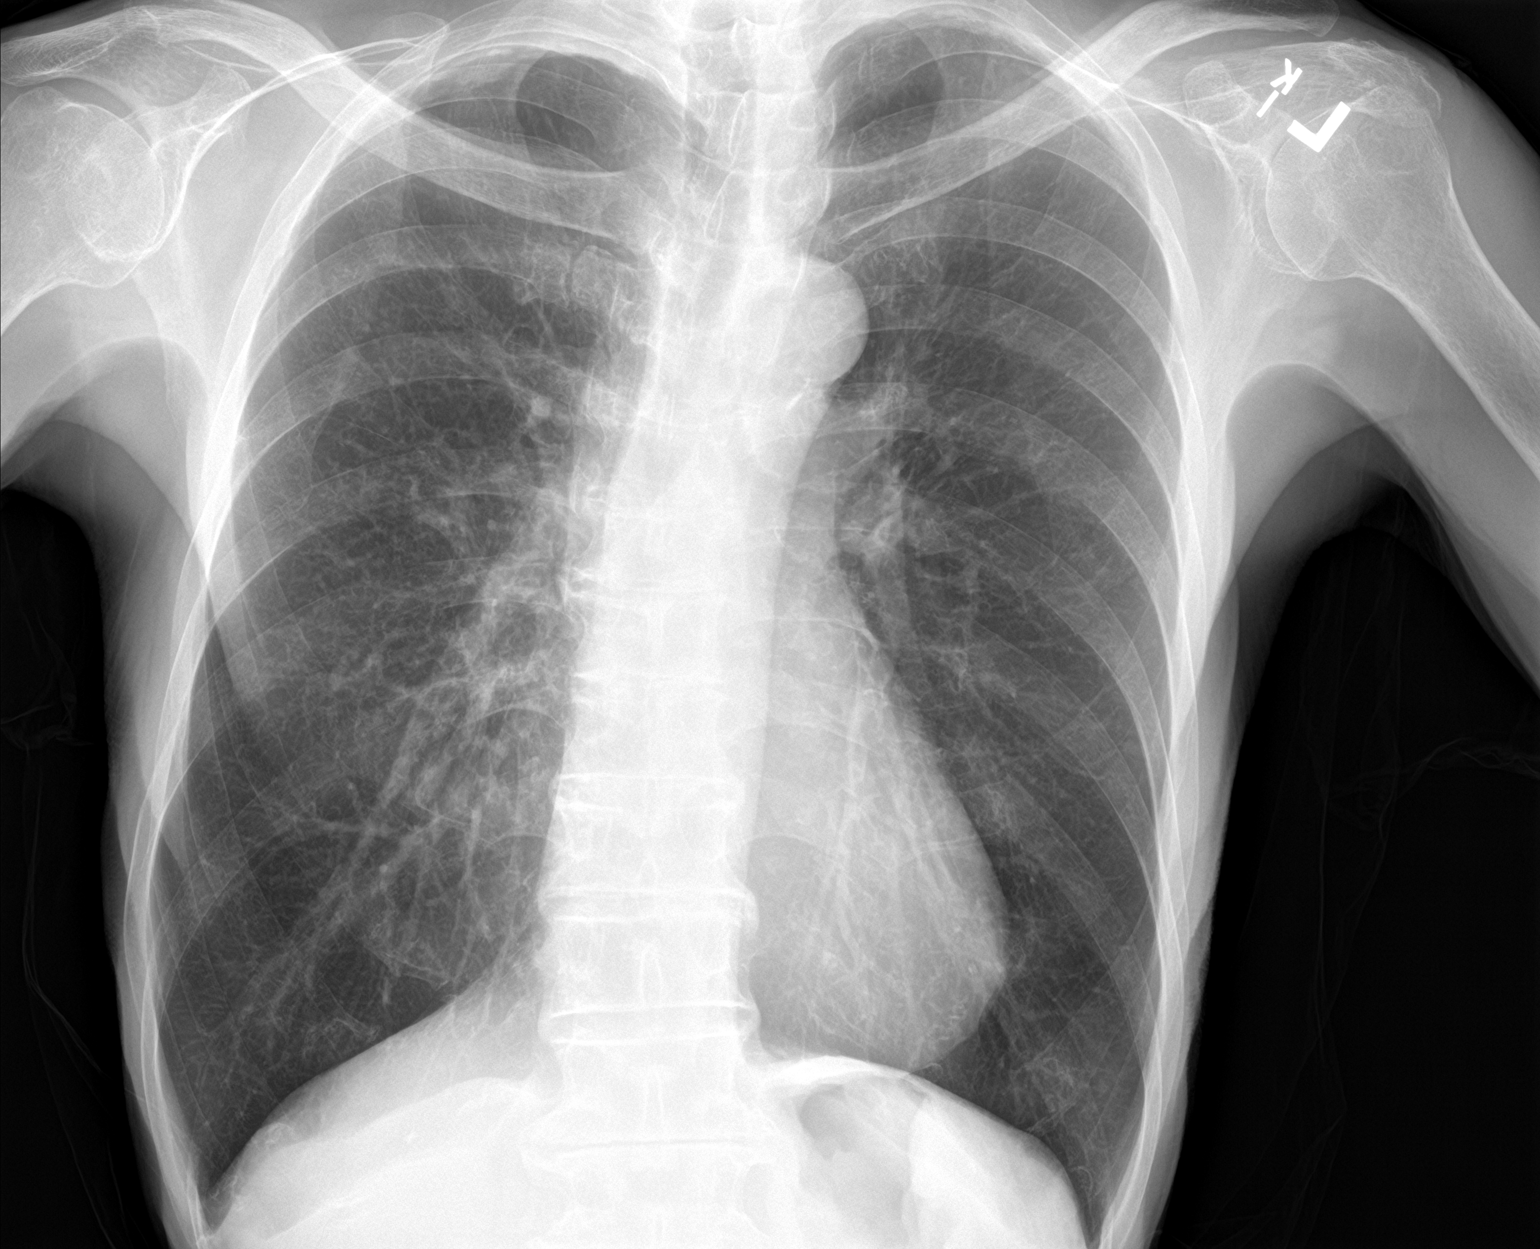

[3 of 3 positions shown; findings below may reference images not displayed]

FINDINGS: Stable emphysematous changes with hyperinflation and attenuation of
pulmonary vasculature. No acute pulmonary findings or worrisome
pulmonary lesions. The cardiac silhouette, mediastinal and hilar
contours are within normal limits and stable. Mild tortuosity
minimal calcification of the thoracic aorta. The bony thorax is
intact.
IMPRESSION: Mild stable emphysematous changes but no acute pulmonary findings.

## 2018-12-22 ENCOUNTER — Ambulatory Visit: Payer: Medicare Other | Admitting: Internal Medicine

## 2019-01-11 DIAGNOSIS — N138 Other obstructive and reflux uropathy: Secondary | ICD-10-CM | POA: Diagnosis not present

## 2019-01-11 DIAGNOSIS — N401 Enlarged prostate with lower urinary tract symptoms: Secondary | ICD-10-CM | POA: Diagnosis not present

## 2019-03-09 ENCOUNTER — Telehealth: Payer: Self-pay | Admitting: Internal Medicine

## 2019-03-09 NOTE — Telephone Encounter (Signed)
Informed patient he needs to get at pharmacy please advise

## 2019-03-09 NOTE — Telephone Encounter (Signed)
Pt wife called to see if pt can get a shingles shot. Please advise

## 2019-03-09 NOTE — Telephone Encounter (Signed)
Noted  

## 2019-03-20 ENCOUNTER — Encounter: Payer: Self-pay | Admitting: Internal Medicine

## 2019-03-20 ENCOUNTER — Ambulatory Visit (INDEPENDENT_AMBULATORY_CARE_PROVIDER_SITE_OTHER): Payer: Medicare Other | Admitting: Internal Medicine

## 2019-03-20 ENCOUNTER — Ambulatory Visit (INDEPENDENT_AMBULATORY_CARE_PROVIDER_SITE_OTHER)
Admission: RE | Admit: 2019-03-20 | Discharge: 2019-03-20 | Disposition: A | Discharge status: Home / Self Care | Payer: Medicare Other | Source: Ambulatory Visit | Attending: Internal Medicine | Admitting: Internal Medicine

## 2019-03-20 ENCOUNTER — Other Ambulatory Visit: Payer: Self-pay

## 2019-03-20 ENCOUNTER — Other Ambulatory Visit (INDEPENDENT_AMBULATORY_CARE_PROVIDER_SITE_OTHER): Payer: Medicare Other

## 2019-03-20 VITALS — BP 106/76 | HR 65 | Temp 98.3°F | Ht 63.0 in | Wt 91.0 lb

## 2019-03-20 DIAGNOSIS — E538 Deficiency of other specified B group vitamins: Secondary | ICD-10-CM

## 2019-03-20 DIAGNOSIS — F329 Major depressive disorder, single episode, unspecified: Secondary | ICD-10-CM

## 2019-03-20 DIAGNOSIS — M545 Low back pain, unspecified: Secondary | ICD-10-CM

## 2019-03-20 DIAGNOSIS — G8929 Other chronic pain: Secondary | ICD-10-CM

## 2019-03-20 DIAGNOSIS — Z0001 Encounter for general adult medical examination with abnormal findings: Secondary | ICD-10-CM

## 2019-03-20 DIAGNOSIS — R109 Unspecified abdominal pain: Secondary | ICD-10-CM | POA: Diagnosis not present

## 2019-03-20 DIAGNOSIS — R7302 Impaired glucose tolerance (oral): Secondary | ICD-10-CM

## 2019-03-20 DIAGNOSIS — R634 Abnormal weight loss: Secondary | ICD-10-CM

## 2019-03-20 DIAGNOSIS — E559 Vitamin D deficiency, unspecified: Secondary | ICD-10-CM

## 2019-03-20 DIAGNOSIS — M4316 Spondylolisthesis, lumbar region: Secondary | ICD-10-CM

## 2019-03-20 DIAGNOSIS — F32A Depression, unspecified: Secondary | ICD-10-CM

## 2019-03-20 DIAGNOSIS — E611 Iron deficiency: Secondary | ICD-10-CM

## 2019-03-20 DIAGNOSIS — J439 Emphysema, unspecified: Secondary | ICD-10-CM | POA: Diagnosis not present

## 2019-03-20 LAB — HEPATIC FUNCTION PANEL
ALT: 12 U/L (ref 0–53)
AST: 20 U/L (ref 0–37)
Albumin: 4.3 g/dL (ref 3.5–5.2)
Alkaline Phosphatase: 60 U/L (ref 39–117)
Bilirubin, Direct: 0.1 mg/dL (ref 0.0–0.3)
Total Bilirubin: 0.5 mg/dL (ref 0.2–1.2)
Total Protein: 7 g/dL (ref 6.0–8.3)

## 2019-03-20 LAB — CBC WITH DIFFERENTIAL/PLATELET
Basophils Absolute: 0 10*3/uL (ref 0.0–0.1)
Basophils Relative: 0.3 % (ref 0.0–3.0)
Eosinophils Absolute: 0 10*3/uL (ref 0.0–0.7)
Eosinophils Relative: 0.5 % (ref 0.0–5.0)
HCT: 38 % — ABNORMAL LOW (ref 39.0–52.0)
Hemoglobin: 12.8 g/dL — ABNORMAL LOW (ref 13.0–17.0)
Lymphocytes Relative: 14.5 % (ref 12.0–46.0)
Lymphs Abs: 0.7 10*3/uL (ref 0.7–4.0)
MCHC: 33.7 g/dL (ref 30.0–36.0)
MCV: 97.9 fl (ref 78.0–100.0)
Monocytes Absolute: 0.5 10*3/uL (ref 0.1–1.0)
Monocytes Relative: 9.5 % (ref 3.0–12.0)
Neutro Abs: 3.6 10*3/uL (ref 1.4–7.7)
Neutrophils Relative %: 75.2 % (ref 43.0–77.0)
Platelets: 230 10*3/uL (ref 150.0–400.0)
RBC: 3.88 Mil/uL — ABNORMAL LOW (ref 4.22–5.81)
RDW: 14.7 % (ref 11.5–15.5)
WBC: 4.8 10*3/uL (ref 4.0–10.5)

## 2019-03-20 LAB — BASIC METABOLIC PANEL
BUN: 22 mg/dL (ref 6–23)
CO2: 29 mEq/L (ref 19–32)
Calcium: 9.4 mg/dL (ref 8.4–10.5)
Chloride: 104 mEq/L (ref 96–112)
Creatinine, Ser: 0.71 mg/dL (ref 0.40–1.50)
GFR: 104.69 mL/min (ref >60.00)
Glucose, Bld: 119 mg/dL — ABNORMAL HIGH (ref 70–99)
Potassium: 4.3 mEq/L (ref 3.5–5.1)
Sodium: 142 mEq/L (ref 135–145)

## 2019-03-20 LAB — URINALYSIS, ROUTINE W REFLEX MICROSCOPIC
Bilirubin Urine: NEGATIVE
Hgb urine dipstick: NEGATIVE
Ketones, ur: NEGATIVE
Leukocytes,Ua: NEGATIVE
Nitrite: NEGATIVE
RBC / HPF: NONE SEEN (ref 0–?)
Specific Gravity, Urine: >=1.03 — AB (ref 1.000–1.030)
Urine Glucose: NEGATIVE
Urobilinogen, UA: 0.2 (ref 0.0–1.0)
pH: 6 (ref 5.0–8.0)

## 2019-03-20 LAB — LIPID PANEL
Cholesterol: 217 mg/dL — ABNORMAL HIGH (ref 0–200)
HDL: 85 mg/dL (ref >39.00)
LDL Cholesterol: 121 mg/dL — ABNORMAL HIGH (ref 0–99)
NonHDL: 131.65
Total CHOL/HDL Ratio: 3
Triglycerides: 53 mg/dL (ref 0.0–149.0)
VLDL: 10.6 mg/dL (ref 0.0–40.0)

## 2019-03-20 LAB — IBC PANEL
Iron: 47 ug/dL (ref 42–165)
Saturation Ratios: 13.9 % — ABNORMAL LOW (ref 20.0–50.0)
Transferrin: 241 mg/dL (ref 212.0–360.0)

## 2019-03-20 LAB — TSH: TSH: 0.36 u[IU]/mL (ref 0.35–4.50)

## 2019-03-20 LAB — VITAMIN D 25 HYDROXY (VIT D DEFICIENCY, FRACTURES): VITD: 78.41 ng/mL (ref 30.00–100.00)

## 2019-03-20 LAB — VITAMIN B12: Vitamin B-12: >1500 pg/mL — ABNORMAL HIGH (ref 211–911)

## 2019-03-20 LAB — HEMOGLOBIN A1C: Hgb A1c MFr Bld: 6.3 % (ref 4.6–6.5)

## 2019-03-20 LAB — PSA: PSA: 1.18 ng/mL (ref 0.10–4.00)

## 2019-03-20 NOTE — Progress Notes (Signed)
Subjective:    Patient ID: Jacob LaddChin K Mcmichen, male    DOB: [birth year], 85 y.o.   MRN: [id number]  HPI  Here for wellness and f/u;  Overall doing ok;  Pt denies Chest pain, worsening SOB, DOE, wheezing, orthopnea, PND, worsening LE edema, palpitations, dizziness or syncope.  Pt denies neurological change such as new headache, facial or extremity weakness.  Pt denies polydipsia, polyuria, or low sugar symptoms. Pt states overall good compliance with treatment and medications, good tolerability, and has been trying to follow appropriate diet.  Pt denies worsening depressive symptoms, suicidal ideation or panic. No fever, night sweats, wt loss, loss of appetite, or other constitutional symptoms.  Pt states good ability with ADL's, has low fall risk, home safety reviewed and adequate, no other significant changes in hearing or vision, and only occasionally active with exercise.  C/o generalized weakness.   Walks bent over now, Pt continues to have recurring near constant now LBP without change in severity, bowel or bladder change, fever, wt loss,  worsening LE pain/numbness/weakness, gait change or falls, but worries about falling now.  No prior imaging Wt Readings from Last 3 Encounters:  03/20/19 91 lb (41.3 kg)  09/07/18 95 lb (43.1 kg)  06/16/18 96 lb (43.5 kg)  Also Denies worsening reflux, dysphagia, n/v, bowel change or blood, but has recurrent mid and upper abd pain worse with eating and recent wt loss for unclear reasons. Past Medical History:  Diagnosis Date  . Anxiety state, unspecified 02/23/2013  . Bilateral hearing loss 02/23/2013  . BPH (benign prostatic hypertrophy)   . Cachexia (HCC) 03/14/2013  . Depression 03/14/2013  . Diverticulitis   . Hyperlipidemia 12/31/2015  . Impaired glucose tolerance 03/14/2013  . Insomnia 03/14/2013  . Irritable bowel 03/14/2013   Past Surgical History:  Procedure Laterality Date  . PROSTATE SURGERY    . SHOULDER SURGERY      reports that he has quit smoking. He  has never used smokeless tobacco. He reports current alcohol use. He reports that he does not use drugs. family history is not on file. No Known Allergies Current Outpatient Medications on File Prior to Visit  Medication Sig Dispense Refill  . Cholecalciferol (VITAMIN D) 2000 UNITS CAPS Take 1 capsule by mouth daily.    Marland Kitchen. dutasteride (AVODART) 0.5 MG capsule Take 1 capsule (0.5 mg total) by mouth daily. 90 capsule 3  . galantamine (RAZADYNE ER) 16 MG 24 hr capsule Take 1 capsule (16 mg total) by mouth daily with breakfast. 90 capsule 3  . Lutein 20 MG TABS Take by mouth daily.    . mirtazapine (REMERON) 15 MG tablet Take 1 tablet (15 mg total) by mouth at bedtime. 90 tablet 3  . Multiple Vitamin (MULTIVITAMIN) tablet Take 1 tablet by mouth daily.    Marland Kitchen. omega-3 acid ethyl esters (LOVAZA) 1 g capsule Take 1 g by mouth daily.    . penicillin v potassium (VEETID) 500 MG tablet Take 1 tablet (500 mg total) by mouth 3 (three) times daily. 30 tablet 0  . Probiotic Product (PROBIOTIC PO) Take 1 tablet by mouth daily.    . tamsulosin (FLOMAX) 0.4 MG CAPS capsule Take 1 capsule (0.4 mg total) by mouth daily. 90 capsule 3   No current facility-administered medications on file prior to visit.    Review of Systems Constitutional: Negative for other unusual diaphoresis, sweats, appetite or weight changes HENT: Negative for other worsening hearing loss, ear pain, facial swelling, mouth sores or neck stiffness.  Eyes: Negative for other worsening pain, redness or other visual disturbance.  Respiratory: Negative for other stridor or swelling Cardiovascular: Negative for other palpitations or other chest pain  Gastrointestinal: Negative for worsening diarrhea or loose stools, blood in stool, distention or other pain Genitourinary: Negative for hematuria, flank pain or other change in urine volume.  Musculoskeletal: Negative for myalgias or other joint swelling.  Skin: Negative for other color change, or  other wound or worsening drainage.  Neurological: Negative for other syncope or numbness. Hematological: Negative for other adenopathy or swelling Psychiatric/Behavioral: Negative for hallucinations, other worsening agitation, SI, self-injury, or new decreased concentration All other system neg per pt    Objective:   Physical Exam BP 106/76   Pulse 65   Temp 98.3 F (36.8 C) (Oral)   Ht 5\' 3"  (1.6 m)   Wt 91 lb (41.3 kg)   SpO2 96%   BMI 16.12 kg/m  VS noted,  Constitutional: Pt is oriented to person, place, and time. Appears well-developed and well-nourished, in no significant distress and comfortable Head: Normocephalic and atraumatic  Eyes: Conjunctivae and EOM are normal. Pupils are equal, round, and reactive to light Right Ear: External ear normal without discharge Left Ear: External ear normal without discharge Nose: Nose without discharge or deformity Mouth/Throat: Oropharynx is without other ulcerations and moist  Neck: Normal range of motion. Neck supple. No JVD present. No tracheal deviation present or significant neck LA or mass Cardiovascular: Normal rate, regular rhythm, normal heart sounds and intact distal pulses.   Pulmonary/Chest: WOB normal and breath sounds without rales or wheezing  Abdominal: Soft. Bowel sounds are normal. No HSM with mild to mod epigastric tender  Musculoskeletal: Normal range of motion. Exhibits no edema Lymphadenopathy: Has no other cervical adenopathy.  Neurological: Pt is alert and oriented to person, place, and time. Pt has normal reflexes. No cranial nerve deficit. Motor grossly intact, Gait intact Skin: Skin is warm and dry. No rash noted or new ulcerations Psychiatric:  Has normal mood and affect. Behavior is normal without agitation No other exam findings Lab Results  Component Value Date   WBC 4.8 03/20/2019   HGB 12.8 (L) 03/20/2019   HCT 38.0 (L) 03/20/2019   PLT 230.0 03/20/2019   GLUCOSE 119 (H) 03/20/2019   CHOL 217 (H)  03/20/2019   TRIG 53.0 03/20/2019   HDL 85.00 03/20/2019   LDLCALC 121 (H) 03/20/2019   ALT 12 03/20/2019   AST 20 03/20/2019   NA 142 03/20/2019   K 4.3 03/20/2019   CL 104 03/20/2019   CREATININE 0.71 03/20/2019   BUN 22 03/20/2019   CO2 29 03/20/2019   TSH 0.36 03/20/2019   PSA 1.18 03/20/2019   HGBA1C 6.3 03/20/2019      Assessment & Plan:

## 2019-03-20 NOTE — Patient Instructions (Addendum)
Please continue all other medications as before, and refills have been done if requested.  Please have the pharmacy call with any other refills you may need.  Please continue your efforts at being more active, low cholesterol diet, and weight control.  You are otherwise up to date with prevention measures today.  Please keep your appointments with your specialists as you may have planned  You will be contacted regarding the referral for: Gastroenterology, CT scan for the abdomen/pelvis, and MRI for the lower back  Please go to the XRAY Department in the Basement (go straight as you get off the elevator) for the x-ray testing  Please go to the LAB in the Basement (turn left off the elevator) for the tests to be done today  You will be contacted by phone if any changes need to be made immediately.  Otherwise, you will receive a letter about your results with an explanation, but please check with MyChart first.  Please remember to sign up for MyChart if you have not done so, as this will be important to you in the future with finding out test results, communicating by private email, and scheduling acute appointments online when needed.  Please return in 6 months, or sooner if needed  You are given the GYM note today

## 2019-03-22 ENCOUNTER — Encounter: Payer: Self-pay | Admitting: Internal Medicine

## 2019-03-22 ENCOUNTER — Telehealth: Payer: Self-pay | Admitting: Emergency Medicine

## 2019-03-22 NOTE — Telephone Encounter (Signed)
FYI: [facility] called stating that CT of Abdomen and Pelvis is usually done with contrast, with and without had specific protocalls, GI advised that with contrast would be able to see the same thing.

## 2019-03-23 ENCOUNTER — Encounter: Payer: Self-pay | Admitting: Internal Medicine

## 2019-03-23 DIAGNOSIS — G8929 Other chronic pain: Secondary | ICD-10-CM | POA: Insufficient documentation

## 2019-03-23 NOTE — Assessment & Plan Note (Signed)
stable overall by history and exam, recent data reviewed with pt, and pt to continue medical treatment as before,  to f/u any worsening symptoms or concerns  

## 2019-03-23 NOTE — Assessment & Plan Note (Signed)
Also for cxr as above,  to f/u any worsening symptoms or concerns

## 2019-03-23 NOTE — Assessment & Plan Note (Signed)
Chronic recent worsening symptomatic, higher risk for fall, no prior imaging, for MRI LS Spine

## 2019-03-23 NOTE — Assessment & Plan Note (Addendum)
Etiology unclear, with recent wt loss, for cxr, ct abd and GI referral  In addition to the time spent performing CPE, I spent an additional 40 minutes face to face,in which greater than 50% of this time was spent in counseling and coordination of care for patient's acute illness as documented, including the differential dx, treatment, further evaluation and other management of abd pain, low back pain, depression, HLD, hyperglycemia

## 2019-04-18 ENCOUNTER — Other Ambulatory Visit: Payer: Medicare Other

## 2019-04-25 ENCOUNTER — Encounter: Payer: Self-pay | Admitting: Internal Medicine

## 2019-04-25 ENCOUNTER — Ambulatory Visit: Payer: Medicare Other | Admitting: Internal Medicine

## 2019-04-25 ENCOUNTER — Other Ambulatory Visit: Payer: Self-pay

## 2019-04-25 VITALS — BP 100/60 | HR 67 | Temp 98.1°F | Ht 60.0 in | Wt 90.2 lb

## 2019-04-25 DIAGNOSIS — R634 Abnormal weight loss: Secondary | ICD-10-CM

## 2019-04-25 DIAGNOSIS — G3184 Mild cognitive impairment, so stated: Secondary | ICD-10-CM

## 2019-04-25 DIAGNOSIS — R630 Anorexia: Secondary | ICD-10-CM

## 2019-04-25 DIAGNOSIS — F321 Major depressive disorder, single episode, moderate: Secondary | ICD-10-CM

## 2019-04-25 NOTE — Progress Notes (Signed)
Jacob Mason 85 y.o. 1929-07-14 [id number]  Assessment & Plan:   Encounter Diagnoses  Name Primary?  . Loss of weight Yes  . Anorexia   . Mild cognitive impairment   . Current moderate episode of major depressive disorder, unspecified whether recurrent (HCC)    I suspect his weight loss is a little bit multifactorial.  He has struggled with an anorexia off and on for a number of years.  I am not in favor of any work-up at this point beyond what he is already had and he agrees with that. Galantamine may be contributing based upon the side effect profile I reviewed.  Will ask Dr. Karel JarvisAquino.  I want him to take a full dose of Remeron (mirtazapine) 15 mg at bedtime.  That can help with anorexia insomnia depression and weight loss and improve things.  Follow-up with me in 6 weeks  Protein shakes 2x/day instead of Ensure or at least make sure the Ensure has a high amount of protein in it.  I appreciate the opportunity to care for this patient. CC: Corwin LevinsJohn, [name], MD Dr. 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  Last seen by me in March 2017. Mr. Jacob Mason is here with his wife, she became concerned about his weight loss problem recently, he saw Dr. Jonny RuizJohn who recommended a CT scan but the patient has not done that.  He says my wife made a big deal out of things.  His weight is actually fluctuated off and on over the years.  He has had a lot of troubles with anorexia and nausea at times.  I had put him on mirtazapine in the past but he is only taking 7.5 mg instead of the whole tablet at night.  They are exercising.  Back to the gym in addition to the walking they are doing.  He has been dealing with mild cognitive impairment and was put on galantamine and I think the dose was increased recently.  They drink Ensure.  No family close by 1 daughter in  [place] a son is an Administrator, artsinternist in [place].  No active GI symptoms other than what I have described.   Wt Readings from Last 3 Encounters:  04/25/19 90 lb 4 oz (40.9 kg)  03/20/19 91 lb (41.3 kg)  09/07/18 95 lb (43.1 kg)   Look at older weights In October 2019 his weight was listed as 38.5 kg, in August it was 40 kg and 43 kg and 42 kg in July  No Known Allergies Current Meds  Medication Sig  . Cholecalciferol (VITAMIN D) 2000 UNITS CAPS Take 1 capsule by mouth daily.  Marland Kitchen. galantamine (RAZADYNE ER) 16 MG 24 hr capsule Take 1 capsule (16 mg total) by mouth daily with breakfast.  . Lutein 20 MG TABS Take by mouth daily.  . mirtazapine (REMERON) 15 MG tablet Take 1 tablet (15 mg total) by mouth at bedtime.  . Multiple Vitamin (MULTIVITAMIN) tablet Take 1 tablet by mouth daily.  . Probiotic Product (PROBIOTIC PO) Take 1 tablet by mouth daily.  . tamsulosin (FLOMAX) 0.4 MG CAPS capsule Take 1 capsule (0.4 mg total) by mouth daily.   Past Medical History:  Diagnosis Date  . Anxiety state, unspecified 02/23/2013  . Bilateral hearing loss 02/23/2013  . BPH (benign prostatic hypertrophy)   . Cachexia (HCC) 03/14/2013  .  Depression 03/14/2013  . Diverticulitis   . Hyperlipidemia 12/31/2015  . Impaired glucose tolerance 03/14/2013  . Insomnia 03/14/2013  . Irritable bowel 03/14/2013   Past Surgical History:  Procedure Laterality Date  . PROSTATE SURGERY    . SHOULDER SURGERY     Social History   Social History Narrative   Pt lives in 2 story home with his significant other   Has 2 adult children   PhD in microbiology   Retired Conservator, museum/gallery for [employer].    Family history is not relevant or related parents died of unknown causes   Review of Systems As per HPI.  Memory disturbance and confusion.  All other review of systems are negative.  Objective:   Physical Exam @BP  100/60 (BP Location: Left Arm, Patient Position: Sitting, Cuff Size: Normal)   Pulse 67   Temp 98.1 F (36.7  C) (Oral)   Ht 5' (1.524 m)   Wt 90 lb 4 oz (40.9 kg)   BMI 17.63 kg/m @  General:  Thin and elderly asian man in no acute distress Eyes:  anicteric. ENT:   Mouth and posterior pharynx free of lesions.  Neck:   supple w/o thyromegaly or mass.  Lungs: Clear to auscultation bilaterally., ribs visble and palpable Heart:  S1S2, no rubs, murmurs, gallops. Abdomen:  soft, non-tender, no hepatosplenomegaly, hernia, or mass and BS+. - scaphoid  Lymph:  no cervical or supraclavicular adenopathy. Extremities:   no edema, cyanosis or clubbing Skin   no rash. Psych:  flat mood and  Affect.   Data Reviewed: Neurology notes primary care notes labs previous GI notes as per HPI

## 2019-04-25 NOTE — Patient Instructions (Addendum)
We will see you back October   Take your full dose of your mirtazapine: 15mg  daily at bedtime.   Drink 2 protein shakes a daily.   I appreciate the opportunity to care for you. Silvano Rusk, MD, [facility]

## 2019-04-25 NOTE — Assessment & Plan Note (Signed)
Part of the issue Take full 15 mg mirtazapine

## 2019-04-27 ENCOUNTER — Telehealth: Payer: Self-pay | Admitting: Neurology

## 2019-04-27 NOTE — Telephone Encounter (Signed)
Patient wife wants to know if he can quit a medication ( I could not understand the name of the medication) it is causing patient to loose weight

## 2019-04-27 NOTE — Telephone Encounter (Signed)
Galantamine medication- patient is wanting to stop taking medication it is making him lose weight. Thanks!

## 2019-04-27 NOTE — Telephone Encounter (Signed)
Left message with after hour service on 04-27-19 @ 12:05 pm   Caller is calling about medication

## 2019-04-27 NOTE — Telephone Encounter (Signed)
Is Galabtamine safe to d/c abruptly?

## 2019-04-27 NOTE — Telephone Encounter (Signed)
Recommend reducing to galatamine 8mg  daily first.  This can be administered as extended once-daily dose or 4mg  BID, whichever patient prefers.

## 2019-04-28 NOTE — Telephone Encounter (Signed)
Pts wife states that he hs a three month supply. No refills sent.  Wife instructed to start with 8mg  daily qd for two weeks then qod for 2 weeks then stop.   Instructed to call with any concerns.

## 2019-04-28 NOTE — Telephone Encounter (Signed)
Patient left msg with after hours about medication questions. Was told to stop medication by MD because he is loosing so much weight. Medication is Galatamine ER. Thanks!

## 2019-04-28 NOTE — Telephone Encounter (Signed)
Patient's wife called back regarding her Husbands medication. She said she would like to speak with someone regarding the medication. Please Call. Thanks

## 2019-06-05 ENCOUNTER — Ambulatory Visit: Payer: Medicare Other | Admitting: Internal Medicine

## 2019-06-05 ENCOUNTER — Other Ambulatory Visit: Payer: Self-pay

## 2019-06-05 ENCOUNTER — Encounter: Payer: Self-pay | Admitting: Internal Medicine

## 2019-06-05 VITALS — BP 104/58 | HR 68 | Temp 97.5°F | Ht 63.0 in | Wt 90.4 lb

## 2019-06-05 DIAGNOSIS — R630 Anorexia: Secondary | ICD-10-CM | POA: Diagnosis not present

## 2019-06-05 DIAGNOSIS — G3184 Mild cognitive impairment, so stated: Secondary | ICD-10-CM | POA: Diagnosis not present

## 2019-06-05 DIAGNOSIS — R634 Abnormal weight loss: Secondary | ICD-10-CM | POA: Diagnosis not present

## 2019-06-05 DIAGNOSIS — F320 Major depressive disorder, single episode, mild: Secondary | ICD-10-CM

## 2019-06-05 NOTE — Progress Notes (Signed)
   Jacob Mason 85 y.o. 1928-08-19 [id number]  Assessment & Plan:  Depression Stable/better  Loss of weight This has leveled off and I think as a multifactorial component.  The patient nor I think he needs further work-up.  See other assessment and plan.  Mild cognitive impairment Follow-up neurology as planned. Galanatine dc as patient and wife  thought was reducing appetite  Anorexia Improved on mirtazapine now taking 15 mg nightly.  Suspect functional in origin rather underlying functional GI disturbance or somatic/vegetative disturbance of depression.    I appreciate the opportunity to care for this patient.   Subjective:   Chief Complaint: Follow-up of weight loss  HPI Jacob Mason returns with his wife.  His weight is essentially stable.  The galanatine was discontinued he was tapered off.  Feels like appetite is better and he is sleeping well and eating better on the full dose 15 mg Remeron nightly. Wt Readings from Last 3 Encounters:  06/05/19 90 lb 6 oz (41 kg)  04/25/19 90 lb 4 oz (40.9 kg)  03/20/19 91 lb (41.3 kg)     No Known Allergies Current Meds  Medication Sig  . Cholecalciferol (VITAMIN D) 2000 UNITS CAPS Take 1 capsule by mouth daily.  . Lutein 20 MG TABS Take by mouth daily.  . mirtazapine (REMERON) 15 MG tablet Take 1 tablet (15 mg total) by mouth at bedtime.  . Multiple Vitamin (MULTIVITAMIN) tablet Take 1 tablet by mouth daily.  . Probiotic Product (PROBIOTIC PO) Take 1 tablet by mouth daily.  . tamsulosin (FLOMAX) 0.4 MG CAPS capsule Take 1 capsule (0.4 mg total) by mouth daily.   Past Medical History:  Diagnosis Date  . Anxiety state, unspecified 02/23/2013  . Bilateral hearing loss 02/23/2013  . BPH (benign prostatic hypertrophy)   . Cachexia ([place]) 03/14/2013  . Depression 03/14/2013  . Diverticulitis   . Hyperlipidemia 12/31/2015  . Impaired glucose tolerance 03/14/2013  . Insomnia 03/14/2013  . Irritable bowel 03/14/2013   Past Surgical History:   Procedure Laterality Date  . PROSTATE SURGERY    . SHOULDER SURGERY     Social History   Social History Narrative   Pt lives in 2 story home with his significant other   Has 2 adult children   PhD in microbiology   Retired Conservator, museum/gallery for [employer].    family history is not on file.   Review of Systems As above  Objective:   Physical Exam BP (!) 104/58 (BP Location: Left Arm, Patient Position: Sitting, Cuff Size: Normal)   Pulse 68   Temp (!) 97.5 F (36.4 C) (Oral)   Ht 5\' 3"  (1.6 m)   Wt 90 lb 6 oz (41 kg)   BMI 16.01 kg/m   15 minutes time spent with patient > half in counseling coordination of care\

## 2019-06-05 NOTE — Assessment & Plan Note (Signed)
Follow-up neurology as planned. Galanatine dc as patient and wife  thought was reducing appetite

## 2019-06-05 NOTE — Assessment & Plan Note (Signed)
Stable better

## 2019-06-05 NOTE — Assessment & Plan Note (Signed)
Improved on mirtazapine now taking 15 mg nightly.  Suspect functional in origin rather underlying functional GI disturbance or somatic/vegetative disturbance of depression.

## 2019-06-05 NOTE — Patient Instructions (Signed)
If you are age 83 or older, your body mass index should be between 23-30. Your Body mass index is 16.01 kg/m. If this is out of the aforementioned range listed, please consider follow up with your Primary Care Provider.  If you are age 90 or younger, your body mass index should be between 19-25. Your Body mass index is 16.01 kg/m. If this is out of the aformentioned range listed, please consider follow up with your Primary Care Provider.   Continue treatment plan, follow up as needed.  Thank you for choosing me and [facility]  Silvano Rusk, MD

## 2019-06-05 NOTE — Assessment & Plan Note (Signed)
This has leveled off and I think as a multifactorial component.  The patient nor I think he needs further work-up.  See other assessment and plan.

## 2019-06-13 ENCOUNTER — Telehealth: Payer: Self-pay | Admitting: Internal Medicine

## 2019-06-13 MED ORDER — MIRTAZAPINE 15 MG PO TABS: 15.0000 mg | ORAL_TABLET | Freq: Every day | ORAL | 3 refills | Status: DC

## 2019-06-13 NOTE — Telephone Encounter (Signed)
Pt is requesting a refill for mirtazapine (REMERON) 15 MG tablet    Pharmacy:  [id number] [address], [address] AT [facility] [address] [phone] (Phone) [phone] (Fax)

## 2019-06-13 NOTE — Telephone Encounter (Signed)
Refill sent. See meds.  

## 2019-06-30 ENCOUNTER — Telehealth: Payer: Self-pay | Admitting: Neurology

## 2019-06-30 NOTE — Telephone Encounter (Signed)
Wife was calling in about a medication change and was wanting to speak with the nurse. She was telling me the name of the medication but I couldn't find it on the list. She said whatever it is is causing him to loose weight. Thanks!

## 2019-06-30 NOTE — Telephone Encounter (Signed)
Attempted to reach patient, left message for patient to call back about concerns.

## 2019-07-04 ENCOUNTER — Telehealth: Payer: Medicare Other | Admitting: Neurology

## 2019-09-26 ENCOUNTER — Encounter: Payer: Self-pay | Admitting: Internal Medicine

## 2019-09-26 ENCOUNTER — Ambulatory Visit: Payer: Medicare Other | Admitting: Internal Medicine

## 2019-09-26 ENCOUNTER — Other Ambulatory Visit: Payer: Self-pay

## 2019-09-26 VITALS — BP 118/60 | HR 64 | Temp 98.1°F | Ht 63.0 in | Wt 91.0 lb

## 2019-09-26 DIAGNOSIS — E611 Iron deficiency: Secondary | ICD-10-CM

## 2019-09-26 DIAGNOSIS — M545 Low back pain: Secondary | ICD-10-CM

## 2019-09-26 DIAGNOSIS — E559 Vitamin D deficiency, unspecified: Secondary | ICD-10-CM | POA: Diagnosis not present

## 2019-09-26 DIAGNOSIS — R7302 Impaired glucose tolerance (oral): Secondary | ICD-10-CM

## 2019-09-26 DIAGNOSIS — G3184 Mild cognitive impairment, so stated: Secondary | ICD-10-CM

## 2019-09-26 DIAGNOSIS — G8929 Other chronic pain: Secondary | ICD-10-CM

## 2019-09-26 DIAGNOSIS — Z0001 Encounter for general adult medical examination with abnormal findings: Secondary | ICD-10-CM

## 2019-09-26 DIAGNOSIS — E538 Deficiency of other specified B group vitamins: Secondary | ICD-10-CM

## 2019-09-26 LAB — LIPID PANEL
Cholesterol: 216 mg/dL — ABNORMAL HIGH (ref 0–200)
HDL: 95 mg/dL (ref >39.00)
LDL Cholesterol: 108 mg/dL — ABNORMAL HIGH (ref 0–99)
NonHDL: 120.67
Total CHOL/HDL Ratio: 2
Triglycerides: 63 mg/dL (ref 0.0–149.0)
VLDL: 12.6 mg/dL (ref 0.0–40.0)

## 2019-09-26 LAB — HEPATIC FUNCTION PANEL
ALT: 16 U/L (ref 0–53)
AST: 23 U/L (ref 0–37)
Albumin: 4.2 g/dL (ref 3.5–5.2)
Alkaline Phosphatase: 66 U/L (ref 39–117)
Bilirubin, Direct: 0.1 mg/dL (ref 0.0–0.3)
Total Bilirubin: 0.4 mg/dL (ref 0.2–1.2)
Total Protein: 7.1 g/dL (ref 6.0–8.3)

## 2019-09-26 LAB — URINALYSIS, ROUTINE W REFLEX MICROSCOPIC
Bilirubin Urine: NEGATIVE
Hgb urine dipstick: NEGATIVE
Ketones, ur: NEGATIVE
Leukocytes,Ua: NEGATIVE
Nitrite: NEGATIVE
RBC / HPF: NONE SEEN (ref 0–?)
Specific Gravity, Urine: >=1.03 — AB (ref 1.000–1.030)
Total Protein, Urine: NEGATIVE
Urine Glucose: NEGATIVE
Urobilinogen, UA: 0.2 (ref 0.0–1.0)
pH: 6 (ref 5.0–8.0)

## 2019-09-26 LAB — CBC WITH DIFFERENTIAL/PLATELET
Basophils Absolute: 0 10*3/uL (ref 0.0–0.1)
Basophils Relative: 0.2 % (ref 0.0–3.0)
Eosinophils Absolute: 0 10*3/uL (ref 0.0–0.7)
Eosinophils Relative: 0.5 % (ref 0.0–5.0)
HCT: 38.1 % — ABNORMAL LOW (ref 39.0–52.0)
Hemoglobin: 12.7 g/dL — ABNORMAL LOW (ref 13.0–17.0)
Lymphocytes Relative: 12.3 % (ref 12.0–46.0)
Lymphs Abs: 0.7 10*3/uL (ref 0.7–4.0)
MCHC: 33.4 g/dL (ref 30.0–36.0)
MCV: 98.6 fl (ref 78.0–100.0)
Monocytes Absolute: 0.4 10*3/uL (ref 0.1–1.0)
Monocytes Relative: 7.9 % (ref 3.0–12.0)
Neutro Abs: 4.5 10*3/uL (ref 1.4–7.7)
Neutrophils Relative %: 79.1 % — ABNORMAL HIGH (ref 43.0–77.0)
Platelets: 234 10*3/uL (ref 150.0–400.0)
RBC: 3.87 Mil/uL — ABNORMAL LOW (ref 4.22–5.81)
RDW: 15 % (ref 11.5–15.5)
WBC: 5.7 10*3/uL (ref 4.0–10.5)

## 2019-09-26 LAB — HEMOGLOBIN A1C: Hgb A1c MFr Bld: 6.8 % — ABNORMAL HIGH (ref 4.6–6.5)

## 2019-09-26 LAB — BASIC METABOLIC PANEL
BUN: 22 mg/dL (ref 6–23)
CO2: 33 mEq/L — ABNORMAL HIGH (ref 19–32)
Calcium: 9.4 mg/dL (ref 8.4–10.5)
Chloride: 100 mEq/L (ref 96–112)
Creatinine, Ser: 0.76 mg/dL (ref 0.40–1.50)
GFR: 96.66 mL/min (ref >60.00)
Glucose, Bld: 132 mg/dL — ABNORMAL HIGH (ref 70–99)
Potassium: 4 mEq/L (ref 3.5–5.1)
Sodium: 138 mEq/L (ref 135–145)

## 2019-09-26 LAB — IBC PANEL
Iron: 74 ug/dL (ref 42–165)
Saturation Ratios: 24.4 % (ref 20.0–50.0)
Transferrin: 217 mg/dL (ref 212.0–360.0)

## 2019-09-26 LAB — TSH: TSH: 0.52 u[IU]/mL (ref 0.35–4.50)

## 2019-09-26 LAB — VITAMIN D 25 HYDROXY (VIT D DEFICIENCY, FRACTURES): VITD: 84.12 ng/mL (ref 30.00–100.00)

## 2019-09-26 LAB — VITAMIN B12: Vitamin B-12: >1500 pg/mL — ABNORMAL HIGH (ref 211–911)

## 2019-09-26 MED ORDER — MEMANTINE HCL 10 MG PO TABS: 10.0000 mg | ORAL_TABLET | Freq: Two times a day (BID) | ORAL | 11 refills | Status: AC

## 2019-09-26 NOTE — Assessment & Plan Note (Signed)
Mild to mod, for MRI LS spine to assess given severity and chronicity, and to f/u any worsening symptoms or concerns

## 2019-09-26 NOTE — Patient Instructions (Signed)
Please take all new medication as prescribed - the namenda for memory  Please continue all other medications as before, and refills have been done if requested.  Please have the pharmacy call with any other refills you may need.  Please continue your efforts at being more active, low cholesterol diet, and weight control.  You are otherwise up to date with prevention measures today.  Please keep your appointments with your specialists as you may have planned  You will be contacted regarding the referral for: MRI for the lower back  Please go to the LAB at the blood drawing area for the tests to be done  You will be contacted by phone if any changes need to be made immediately.  Otherwise, you will receive a letter about your results with an explanation, but please check with MyChart first.  Please remember to sign up for MyChart if you have not done so, as this will be important to you in the future with finding out test results, communicating by private email, and scheduling acute appointments online when needed.  Please make an Appointment to return in 6 months, or sooner if needed

## 2019-09-26 NOTE — Assessment & Plan Note (Signed)
stable overall by history and exam, recent data reviewed with pt, and pt to continue medical treatment as before,  to f/u any worsening symptoms or concerns  

## 2019-09-26 NOTE — Assessment & Plan Note (Signed)

## 2019-09-26 NOTE — Assessment & Plan Note (Addendum)
With ? Mild worsening, ok for namenda 10 bid  I spent 30 minutes in addition to time for wellness examination in preparing to see the patient by review of recent labs, imaging and procedures, obtaining and reviewing separately obtained history, communicating with the patient and family or caregiver, ordering medications, tests or procedures, and documenting clinical information in the EHR including the differential Dx, treatment, and any further evaluation and other management of cognitive impairment, hyperglycemia, chronic LBP

## 2019-09-26 NOTE — Progress Notes (Signed)
Subjective:    Patient ID: Jacob Mason, male    DOB: 03-01-1929, 86 y.o.   MRN: [id number]  HPI  Here for wellness and f/u;  Overall doing ok;  Pt denies Chest pain, worsening SOB, DOE, wheezing, orthopnea, PND, worsening LE edema, palpitations, dizziness or syncope.  Pt denies neurological change such as new headache, facial or extremity weakness.  Pt denies polydipsia, polyuria, or low sugar symptoms. Pt states overall good compliance with treatment and medications, good tolerability, and has been trying to follow appropriate diet.  Pt denies worsening depressive symptoms, suicidal ideation or panic. No fever, night sweats, wt loss, loss of appetite, or other constitutional symptoms.  Pt states good ability with ADL's, has low fall risk, home safety reviewed and adequate, no other significant changes in hearing or vision, and only occasionally active with exercise. S/p covid shots x 2.   Wt Readings from Last 3 Encounters:  09/26/19 91 lb (41.3 kg)  06/05/19 90 lb 6 oz (41 kg)  04/25/19 90 lb 4 oz (40.9 kg)  Pt continues to have recurring LBP without change in severity, bowel or bladder change, fever, wt loss,  worsening LE pain/numbness/weakness, gait change or falls. Also with mild worsening memory loss, stopped galantamine after suggested per GI this might be related to wt loss Past Medical History:  Diagnosis Date  . Anxiety state, unspecified 02/23/2013  . Bilateral hearing loss 02/23/2013  . BPH (benign prostatic hypertrophy)   . Cachexia ([place]) 03/14/2013  . Depression 03/14/2013  . Diverticulitis   . Hyperlipidemia 12/31/2015  . Impaired glucose tolerance 03/14/2013  . Insomnia 03/14/2013  . Irritable bowel 03/14/2013   Past Surgical History:  Procedure Laterality Date  . PROSTATE SURGERY    . SHOULDER SURGERY      reports that he has quit smoking. He has never used smokeless tobacco. He reports current alcohol use. He reports that he does not use drugs. family history is not on file. No  Known Allergies Current Outpatient Medications on File Prior to Visit  Medication Sig Dispense Refill  . Cholecalciferol (VITAMIN D) 2000 UNITS CAPS Take 1 capsule by mouth daily.    . Lutein 20 MG TABS Take by mouth daily.    . mirtazapine (REMERON) 15 MG tablet Take 1 tablet (15 mg total) by mouth at bedtime. 90 tablet 3  . Multiple Vitamin (MULTIVITAMIN) tablet Take 1 tablet by mouth daily.    . Omega-3 Fatty Acids (FISH OIL CONCENTRATE PO) Take by mouth.    . Probiotic Product (PROBIOTIC PO) Take 1 tablet by mouth daily.    . tamsulosin (FLOMAX) 0.4 MG CAPS capsule Take 1 capsule (0.4 mg total) by mouth daily. 90 capsule 3   No current facility-administered medications on file prior to visit.   Review of Systems All otherwise neg per pt     Objective:   Physical Exam BP 118/60   Pulse 64   Temp 98.1 F (36.7 C)   Ht 5\' 3"  (1.6 m)   Wt 91 lb (41.3 kg)   SpO2 99%   BMI 16.12 kg/m  VS noted,  Constitutional: Pt appears in NAD HENT: Head: NCAT.  Right Ear: External ear normal.  Left Ear: External ear normal.  Eyes: . Pupils are equal, round, and reactive to light. Conjunctivae and EOM are normal Nose: without d/c or deformity Neck: Neck supple. Gross normal ROM Cardiovascular: Normal rate and regular rhythm.   Pulmonary/Chest: Effort normal and breath sounds without rales or wheezing.  Abd:  Soft, NT, ND, + BS, no organomegaly Spine nontender Neurological: Pt is alert. At baseline orientation, motor grossly intact Skin: Skin is warm. No rashes, other new lesions, no LE edema Psychiatric: Pt behavior is normal without agitation  All otherwise neg per pt Lab Results  Component Value Date   WBC 5.7 09/26/2019   HGB 12.7 (L) 09/26/2019   HCT 38.1 (L) 09/26/2019   PLT 234.0 09/26/2019   GLUCOSE 132 (H) 09/26/2019   CHOL 216 (H) 09/26/2019   TRIG 63.0 09/26/2019   HDL 95.00 09/26/2019   LDLCALC 108 (H) 09/26/2019   ALT 16 09/26/2019   AST 23 09/26/2019   NA 138  09/26/2019   K 4.0 09/26/2019   CL 100 09/26/2019   CREATININE 0.76 09/26/2019   BUN 22 09/26/2019   CO2 33 (H) 09/26/2019   TSH 0.52 09/26/2019   PSA 1.18 03/20/2019   HGBA1C 6.8 (H) 09/26/2019      Assessment & Plan:

## 2019-10-03 ENCOUNTER — Ambulatory Visit
Admission: RE | Admit: 2019-10-03 | Discharge: 2019-10-03 | Disposition: A | Discharge status: Home / Self Care | Payer: Medicare Other | Source: Ambulatory Visit | Attending: Internal Medicine | Admitting: Internal Medicine

## 2019-10-03 ENCOUNTER — Other Ambulatory Visit: Payer: Self-pay

## 2019-10-03 DIAGNOSIS — G8929 Other chronic pain: Secondary | ICD-10-CM

## 2019-10-03 DIAGNOSIS — M48061 Spinal stenosis, lumbar region without neurogenic claudication: Secondary | ICD-10-CM | POA: Diagnosis not present

## 2019-10-04 ENCOUNTER — Other Ambulatory Visit: Payer: Self-pay | Admitting: Internal Medicine

## 2019-10-04 DIAGNOSIS — M5416 Radiculopathy, lumbar region: Secondary | ICD-10-CM

## 2019-10-04 DIAGNOSIS — M48061 Spinal stenosis, lumbar region without neurogenic claudication: Secondary | ICD-10-CM

## 2019-10-13 ENCOUNTER — Other Ambulatory Visit: Payer: Self-pay

## 2019-10-13 ENCOUNTER — Ambulatory Visit: Payer: Medicare Other | Admitting: Orthopaedic Surgery

## 2019-10-13 ENCOUNTER — Ambulatory Visit: Payer: Self-pay

## 2019-10-13 ENCOUNTER — Encounter: Payer: Self-pay | Admitting: Orthopaedic Surgery

## 2019-10-13 VITALS — BP 130/68 | HR 71 | Ht 63.0 in | Wt 91.0 lb

## 2019-10-13 DIAGNOSIS — M48062 Spinal stenosis, lumbar region with neurogenic claudication: Secondary | ICD-10-CM

## 2019-10-13 DIAGNOSIS — M545 Low back pain, unspecified: Secondary | ICD-10-CM

## 2019-10-13 NOTE — Progress Notes (Signed)
Office Visit Note   Patient: Jacob Mason           Date of Birth: [birth year]           MRN: [id number] Visit Date: 10/13/2019              Requested by: Corwin Levins, MD [address] [place],  [place] [address] PCP: Corwin Levins, MD   Assessment & Plan: Visit Diagnoses:  1. Acute left-sided low back pain without sciatica     Plan: We reviewed the MRI scan and report with patient and his wife.  I gave him a copy.  I think he will do better if he ambulates using a cane.  During rainy weather he can use a number [place] or use a golf club if he is walking in the neighborhood.  We discussed decompression surgery particularly at the L4-5 level which is the most severe compression but the rates only at moderate to moderately severe.  He is not really interested in surgery at his age.  I encouraged him to increase his p.o. intake since he has been losing weight with poor appetite as he has gotten older.  He can return if his symptoms progress and is not able to stand or walk as far or would like to consider a discussion about possible surgery.  Follow-Up Instructions: No follow-ups on file.   Orders:  Orders Placed This Encounter  Procedures  . XR Lumbar Spine 2-3 Views   No orders of the defined types were placed in this encounter.     Procedures: No procedures performed   Clinical Data: No additional findings.   Subjective: Chief Complaint  Patient presents with  . Lower Back - Pain    HPI 86 year old male from [place] here with his wife who is normally followed by Dr. Oliver Barre is here with complaints of low back pain particularly with walking or prolonged standing for several months.  No bowel bladder symptoms.  His wife notes that he walks leaning forward in the back with loss of normal lumbar lordosis.  He denies fever chills no previous surgeries.  Patient had an MRI scan 10/04/2019 which showed multilevel spinal stenosis most significant at L4-5 which was moderate to  moderately severe.  Milder changes at other levels.  Patient is a Tourist information centre manager.  Review of Systems posture decreased hearing acuity.  History of anxiety as well as depression.  Hyperlipidemia some insomnia doing better currently.  Weight loss with poor appetite.  Mild cognitive impairment.  Previous albumin level was still in normal range.  Low back pain.   Objective: Vital Signs: BP 130/68   Pulse 71   Ht 5\' 3"  (1.6 m)   Wt 91 lb (41.3 kg)   BMI 16.12 kg/m   Physical Exam Constitutional:      Appearance: He is well-developed.     Comments: Thin elderly ambulatory with flat lumbar spine and slight forward flexed position at hips.  HENT:     Head: Normocephalic and atraumatic.  Eyes:     Pupils: Pupils are equal, round, and reactive to light.  Neck:     Thyroid: No thyromegaly.     Trachea: No tracheal deviation.  Cardiovascular:     Rate and Rhythm: Normal rate.  Pulmonary:     Effort: Pulmonary effort is normal.     Breath sounds: No wheezing.  Abdominal:     General: Bowel sounds are normal.     Palpations: Abdomen  is soft.  Skin:    General: Skin is warm and dry.     Capillary Refill: Capillary refill takes less than 2 seconds.  Neurological:     Mental Status: He is alert and oriented to person, place, and time.  Psychiatric:        Behavior: Behavior normal.        Thought Content: Thought content normal.        Judgment: Judgment normal.     Ortho Exam patient has negative logroll the hips.  Knee and ankle jerk are intact.  Quads are strong.  No rash over exposed skin.  Anterior tib EHL is strong.  Specialty Comments:  No specialty comments available.  Imaging: CLINICAL DATA:  Low back pain with right hip and leg pain for 1 week.  EXAM: MRI LUMBAR SPINE WITHOUT CONTRAST  TECHNIQUE: Multiplanar, multisequence MR imaging of the lumbar spine was performed. No intravenous contrast was administered.  COMPARISON:   None.  FINDINGS: Segmentation: There are five lumbar type vertebral bodies. The last full intervertebral disc space is labeled L5-S1.  Alignment: Reversal of the normal lumbar lordosis and advanced degenerative lumbar spondylosis.  Vertebrae: Very heterogeneous marrow signal likely due to osteoporosis, smoking or anemia. Do not see any discrete worrisome lesions on the STIR sequence. No acute fracture.  Conus medullaris and cauda equina: Conus extends to the bottom of L1 level. Conus and cauda equina appear normal.  Paraspinal and other soft tissues: No significant paraspinal or retroperitoneal findings.  Disc levels:  T12-L1: No significant findings.  L1-2: Advanced degenerative disc disease with a bulging annulus and posterior osteophytic ridging. There is flattening of the ventral thecal sac and mild to moderate bilateral lateral recess stenosis. No significant foraminal stenosis. Mild foraminal encroachment bilaterally.  L2-3: Bulging degenerated annulus, osteophytic ridging and facet disease contributing to mild spinal stenosis and moderate bilateral lateral recess stenosis and mild to moderate bilateral foraminal stenosis.  L3-4: Bulging degenerated and uncovered disc, osteophytic ridging and moderate facet disease contributing to mild to moderate spinal stenosis and moderate to moderately severe bilateral lateral recess stenosis. There is also mild bilateral foraminal stenosis.  L4-5: Bulging degenerated annulus, osteophytic ridging and advanced facet disease with ligamentum flavum thickening contributing to moderate to moderately severe spinal stenosis and moderately severe bilateral lateral recess stenosis. There is also mild bilateral foraminal stenosis.  L5-S1: Bulging degenerated annulus and moderate facet disease but the spinal canal is fairly generous. No significant spinal, lateral recess or foraminal stenosis.  IMPRESSION: 1. Degenerative  lumbar spondylosis with multilevel disc disease and facet disease. 2. Multilevel multifactorial spinal, lateral recess and foraminal stenosis as discussed above at the individual levels. The most significant level is L4-5 where there is moderate to moderately severe spinal stenosis and moderately severe bilateral lateral recess stenosis. 3. Heterogeneous marrow signal likely age related osteoporosis. No discrete bone lesions or fractures.   Electronically Signed   By: Rudie Meyer M.D.   On: 10/04/2019 08:54   PMFS History: Patient Active Problem List   Diagnosis Date Noted  . Anorexia 06/05/2019  . Chronic midline low back pain without sciatica 03/23/2019  . Urinary retention 06/16/2018  . Mild cognitive impairment 06/14/2017  . Hyperlipidemia 12/31/2015  . Cough 09/26/2014  . Irritable bowel 03/14/2013  . Insomnia 03/14/2013  . Depression 03/14/2013  . Impaired glucose tolerance 03/14/2013  . Cachexia ([facility]) 03/14/2013  . Encounter for well adult exam with abnormal findings 02/23/2013  . Bilateral hearing loss 02/23/2013  .  Anxiety state 02/23/2013  . Abdominal pain 02/23/2013  . Loss of weight 02/23/2013  . Diverticulitis   . Benign prostatic hyperplasia    Past Medical History:  Diagnosis Date  . Anxiety state, unspecified 02/23/2013  . Bilateral hearing loss 02/23/2013  . BPH (benign prostatic hypertrophy)   . Cachexia ([place]) 03/14/2013  . Depression 03/14/2013  . Diverticulitis   . Hyperlipidemia 12/31/2015  . Impaired glucose tolerance 03/14/2013  . Insomnia 03/14/2013  . Irritable bowel 03/14/2013    No family history on file.  Past Surgical History:  Procedure Laterality Date  . PROSTATE SURGERY    . SHOULDER SURGERY     Social History   Occupational History  . Occupation: Retired  Tobacco Use  . Smoking status: Former Research scientist (life sciences)  . Smokeless tobacco: Never Used  Substance and Sexual Activity  . Alcohol use: Yes    Alcohol/week: 0.0 standard drinks     Comment: occ   . Drug use: No  . Sexual activity: Not on file

## 2019-10-14 DIAGNOSIS — M48061 Spinal stenosis, lumbar region without neurogenic claudication: Secondary | ICD-10-CM | POA: Insufficient documentation

## 2019-10-16 ENCOUNTER — Telehealth: Payer: Self-pay | Admitting: Internal Medicine

## 2019-10-16 DIAGNOSIS — M48062 Spinal stenosis, lumbar region with neurogenic claudication: Secondary | ICD-10-CM

## 2019-10-16 NOTE — Telephone Encounter (Signed)
Ok this is done 

## 2019-10-16 NOTE — Telephone Encounter (Signed)
Called pt no answer LMOM w/MD response../lmb 

## 2019-10-16 NOTE — Telephone Encounter (Signed)
    Spouse calling, requesting referral for patient to see Neurosurgeon( per Dr Ophelia Charter request). Request referral to  Dr Manson Passey phone [phone] at [facility].

## 2019-10-16 NOTE — Telephone Encounter (Signed)
Done per family request

## 2019-10-19 DIAGNOSIS — H5203 Hypermetropia, bilateral: Secondary | ICD-10-CM | POA: Diagnosis not present

## 2019-11-01 ENCOUNTER — Encounter: Payer: Self-pay | Admitting: Neurology

## 2019-11-01 ENCOUNTER — Ambulatory Visit: Payer: Medicare Other | Admitting: Neurology

## 2019-11-01 ENCOUNTER — Other Ambulatory Visit: Payer: Self-pay

## 2019-11-01 VITALS — BP 106/54 | HR 68 | Ht 63.0 in | Wt 90.0 lb

## 2019-11-01 DIAGNOSIS — G3184 Mild cognitive impairment, so stated: Secondary | ICD-10-CM

## 2019-11-01 MED ORDER — RIVASTIGMINE TARTRATE 1.5 MG PO CAPS: 1.5000 mg | ORAL_CAPSULE | Freq: Two times a day (BID) | ORAL | 0 refills | Status: DC

## 2019-11-01 NOTE — Patient Instructions (Signed)
Good to see you! Let's try Rivastigmine 1.5mg  twice a day and see if you tolerate this better. Call our office in a month for an update so we can send refills. Follow-up in 6 months, call for any changes.  FALL PRECAUTIONS: Be cautious when walking. Scan the area for obstacles that may increase the risk of trips and falls. When getting up in the mornings, sit up at the edge of the bed for a few minutes before getting out of bed. Consider elevating the bed at the head end to avoid drop of blood pressure when getting up. Walk always in a well-lit room (use night lights in the walls). Avoid area rugs or power cords from appliances in the middle of the walkways. Use a walker or a cane if necessary and consider physical therapy for balance exercise. Get your eyesight checked regularly.  FINANCIAL OVERSIGHT: Supervision, especially oversight when making financial decisions or transactions is also recommended.  HOME SAFETY: Consider the safety of the kitchen when operating appliances like stoves, microwave oven, and blender. Consider having supervision and share cooking responsibilities until no longer able to participate in those. Accidents with firearms and other hazards in the house should be identified and addressed as well.  DRIVING: Regarding driving, in patients with progressive memory problems, driving will be impaired. We advise to have someone else do the driving if trouble finding directions or if minor accidents are reported. Independent driving assessment is available to determine safety of driving.  ABILITY TO BE LEFT ALONE: If patient is unable to contact 911 operator, consider using LifeLine, or when the need is there, arrange for someone to stay with patients. Smoking is a fire hazard, consider supervision or cessation. Risk of wandering should be assessed by caregiver and if detected at any point, supervision and safe proof recommendations should be instituted.  MEDICATION SUPERVISION:  Inability to self-administer medication needs to be constantly addressed. Implement a mechanism to ensure safe administration of the medications.  RECOMMENDATIONS FOR ALL PATIENTS WITH MEMORY PROBLEMS: 1. Continue to exercise (Recommend 30 minutes of walking everyday, or 3 hours every week) 2. Increase social interactions - continue going to [place] and enjoy social gatherings with friends and family 3. Eat healthy, avoid fried foods and eat more fruits and vegetables 4. Maintain adequate blood pressure, blood sugar, and blood cholesterol level. Reducing the risk of stroke and cardiovascular disease also helps promoting better memory. 5. Avoid stressful situations. Live a simple life and avoid aggravations. Organize your time and prepare for the next day in anticipation. 6. Sleep well, avoid any interruptions of sleep and avoid any distractions in the bedroom that may interfere with adequate sleep quality 7. Avoid sugar, avoid sweets as there is a strong link between excessive sugar intake, diabetes, and cognitive impairment The Mediterranean diet has been shown to help patients reduce the risk of progressive memory disorders and reduces cardiovascular risk. This includes eating fish, eat fruits and green leafy vegetables, nuts like almonds and hazelnuts, walnuts, and also use olive oil. Avoid fast foods and fried foods as much as possible. Avoid sweets and sugar as sugar use has been linked to worsening of memory function.  There is always a concern of gradual progression of memory problems. If this is the case, then we may need to adjust level of care according to patient needs. Support, both to the patient and caregiver, should then be put into place.

## 2019-11-01 NOTE — Progress Notes (Signed)
NEUROLOGY FOLLOW UP OFFICE NOTE  Jacob Mason [id number] 12/20/1928  HISTORY OF PRESENT ILLNESS: I had the pleasure of seeing Jacob Mason in follow-up in the neurology clinic on 10/31/2019.  The patient was last seen on a year ago for worsening memory. [place] 19/30 in March 2020 (MMSE 25/30 in July 2019, MOCA score in January 2019 was 26/30). He had side effects on Donepezil and was switched to galantamine, however his wife felt this was causing weight loss and asked to stop it. He was started on Memantine however he did not like it, and stopped it as well. They restarted galantamine but takes it every other day. He feels his memory is not too good. He continues to drive without difficulties. His wife administers medications because he was "kind of forgetting." All of their bills are on autopay. He denies misplacing things frequently. He is independent with dressing and bathing, no hygiene issues. He denies any headaches, dizziness, focal numbness/tingling/weakness, no falls. Sleep is good with mirtazapine. Mood is good, no paranoia or hallucinations.  History on Initial Assessment: This is a pleasant 86 yo RH man with a history of diet-controlled hyperlipidemia, anxiety, depression, who presented for evaluation of worsening memory. When asked about his memory, he states "she says my memory is kina bad, I agree some." His wife started noticing changes around 3 months ago, he would repeat himself, asking the same questions repeatedly several days in a row. He has difficulty recalling events from a few hours prior. He cannot find important papers at home. He denies getting lost driving, no missed bills or medications. His wife denies any personality changes, no hallucinations. He had stomach trouble on Aricept, currently tolerating Galantamine ER 8mg  daily without side effects. He goes to the gym daily. There is no family history of dementia, no history of head injuries, he rarely drinks alcohol. He denies any  headaches, dizziness, vision changes, neck/back pain, focal numbness/tingling/weakness, anosmia, tremors. No falls.   Diagnostic Data: MRI brain in 06/2017 no acute changes, there was mild generalized atrophy, no significant microvascular disease. Lab Results  Component Value Date   TSH 0.52 09/26/2019   Lab Results  Component Value Date   VITAMINB12 >1500 (H) 09/26/2019    PAST MEDICAL HISTORY: Past Medical History:  Diagnosis Date  . Anxiety state, unspecified 02/23/2013  . Bilateral hearing loss 02/23/2013  . BPH (benign prostatic hypertrophy)   . Cachexia ([place]) 03/14/2013  . Depression 03/14/2013  . Diverticulitis   . Hyperlipidemia 12/31/2015  . Impaired glucose tolerance 03/14/2013  . Insomnia 03/14/2013  . Irritable bowel 03/14/2013    MEDICATIONS: Current Outpatient Medications on File Prior to Visit  Medication Sig Dispense Refill  . Cholecalciferol (VITAMIN D) 2000 UNITS CAPS Take 1 capsule by mouth daily.    . Lutein 20 MG TABS Take by mouth daily.    . mirtazapine (REMERON) 15 MG tablet Take 1 tablet (15 mg total) by mouth at bedtime. 90 tablet 3  . Multiple Vitamin (MULTIVITAMIN) tablet Take 1 tablet by mouth daily.    . Omega-3 Fatty Acids (FISH OIL CONCENTRATE PO) Take by mouth.    . Probiotic Product (PROBIOTIC PO) Take 1 tablet by mouth daily.    . tamsulosin (FLOMAX) 0.4 MG CAPS capsule Take 1 capsule (0.4 mg total) by mouth daily. 90 capsule 3  . memantine (NAMENDA) 10 MG tablet Take 1 tablet (10 mg total) by mouth 2 (two) times daily. (Patient not taking: Reported on 11/01/2019) 60 tablet 11  No current facility-administered medications on file prior to visit.    ALLERGIES: No Known Allergies  FAMILY HISTORY: History reviewed. No pertinent family history.  SOCIAL HISTORY: Social History   Socioeconomic History  . Marital status: Married    Spouse name: Not on file  . Number of children: 2  . Years of education: PHD  . Highest education level: Not on file   Occupational History  . Occupation: Retired  Tobacco Use  . Smoking status: Former Games developer  . Smokeless tobacco: Never Used  Substance and Sexual Activity  . Alcohol use: Yes    Alcohol/week: 0.0 standard drinks    Comment: occ   . Drug use: No  . Sexual activity: Not on file  Other Topics Concern  . 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    Social Determinants of Health   Financial Resource Strain:   . Difficulty of Paying Living Expenses:   Food Insecurity:   . Worried About Programme researcher, broadcasting/film/video in the Last Year:   . Barista in the Last Year:   Transportation Needs:   . Freight forwarder (Medical):   Marland Kitchen Lack of Transportation (Non-Medical):   Physical Activity:   . Days of Exercise per Week:   . Minutes of Exercise per Session:   Stress:   . Feeling of Stress :   Social Connections:   . Frequency of Communication with Friends and Family:   . Frequency of Social Gatherings with Friends and Family:   . Attends Religious Services:   . Active Member of Clubs or Organizations:   . Attends Banker Meetings:   Marland Kitchen Marital Status:   Intimate Partner Violence:   . Fear of Current or Ex-Partner:   . Emotionally Abused:   Marland Kitchen Physically Abused:   . Sexually Abused:     REVIEW OF SYSTEMS: Constitutional: No fevers, chills, or sweats, no generalized fatigue, change in appetite Eyes: No visual changes, double vision, eye pain Ear, nose and throat: No hearing loss, ear pain, nasal congestion, sore throat Cardiovascular: No chest pain, palpitations Respiratory:  No shortness of breath at rest or with exertion, wheezes GastrointestinaI: No nausea, vomiting, diarrhea, abdominal pain, fecal incontinence Genitourinary:  No dysuria, urinary retention or frequency Musculoskeletal:  No neck pain, back pain Integumentary:  No rash, pruritus, skin lesions Neurological: as above Psychiatric: No depression, insomnia, anxiety Endocrine: No palpitations, fatigue, diaphoresis, mood swings, change in appetite, change in weight, increased thirst Hematologic/Lymphatic:  No anemia, purpura, petechiae. Allergic/Immunologic: no itchy/runny eyes, nasal congestion, recent allergic reactions, rashes  PHYSICAL EXAM: Vitals:   11/01/19 1343  BP: (!) 106/54  Pulse: 68  SpO2: 96%   General: No acute distress Skin/Extremities: No rash, no edema Neurological Exam: alert and oriented to person, place, and time. No aphasia or dysarthria. Fund of knowledge is appropriate.  Remote and recent memory impaired. Attention and concentration are normal. More difficulty spelling WORLD backward "[place]". Able to name objects and repeat phrases. MMSE - Mini Mental State Exam 11/01/2019 03/04/2018  Orientation to time 4 4  Orientation to Place 5 5  Registration 3 3  Attention/ Calculation 2 5  Recall 0 0  Language- name 2 objects 2 2  Language- repeat 1 1  Language- follow 3 step command 1 2  Language-  read & follow direction 1 1  Write a sentence 1 1  Copy design 1 1  Total score 21 25    Montreal Cognitive Assessment  10/21/2018 08/30/2017  Visuospatial/ Executive (0/5) 4 5  Naming (0/3) 1 3  Attention: Read list of digits (0/2) 2 2  Attention: Read list of letters (0/1) 1 1  Attention: Serial 7 subtraction starting at 100 (0/3) 3 3  Language: Repeat phrase (0/2) 0 2  Language : Fluency (0/1) 1 1  Abstraction (0/2) 0 2  Delayed Recall (0/5) 1 1  Orientation (0/6) 6 6  Total 19 26   Cranial nerves: Extraocular movements intact with no nystagmus. No facial asymmetry. Motor: moves all extremities symmetrically, at least anti-gravity x 4. No incoordination on finger to nose testing. Gait: narrow-based and steady.  IMPRESSION: This is a pleasant 86 yo RH man with a history of diet-controlled hyperlipidemia, insomnia, anxiety,  depression, with mild cognitive impairment, etiology likely Alzheimer's disease. Overall no significant difficulties with complex tasks. There has been decline since last visit, MMSE today 21/30 (25/30 in July 2019). He had side effects on Donepezil, Galantamine, and Memantine, and is willing to start Rivastigmine 1.5mg  BID, side effects discussed. His wife will call in a month for an update if they would like refills. Continue to monitor driving. He will follow-up in 6 months or so and knows to call for any changes.   Thank you for allowing me to participate in her care.  Please do not hesitate to call for any questions or concerns.   Patrcia Dolly, M.D.   CC: Dr. Jonny Ruiz

## 2019-11-23 ENCOUNTER — Other Ambulatory Visit: Payer: Self-pay

## 2019-11-23 MED ORDER — RIVASTIGMINE TARTRATE 1.5 MG PO CAPS: 1.5000 mg | ORAL_CAPSULE | Freq: Two times a day (BID) | ORAL | 0 refills | Status: DC

## 2019-11-27 ENCOUNTER — Other Ambulatory Visit: Payer: Self-pay

## 2019-12-05 DIAGNOSIS — M48062 Spinal stenosis, lumbar region with neurogenic claudication: Secondary | ICD-10-CM | POA: Diagnosis not present

## 2019-12-05 DIAGNOSIS — M5136 Other intervertebral disc degeneration, lumbar region: Secondary | ICD-10-CM | POA: Diagnosis not present

## 2019-12-26 ENCOUNTER — Other Ambulatory Visit: Payer: Self-pay

## 2019-12-26 ENCOUNTER — Telehealth: Payer: Self-pay | Admitting: Neurology

## 2019-12-26 MED ORDER — RIVASTIGMINE TARTRATE 1.5 MG PO CAPS: 1.5000 mg | ORAL_CAPSULE | Freq: Two times a day (BID) | ORAL | 1 refills | Status: DC

## 2019-12-26 NOTE — Telephone Encounter (Signed)
Patient called in stating [facility] on [address] in [place] needs Dr. Rosalyn Gess permission to refill his Rivastigmine. The patient would like to get a 3 month supply.

## 2019-12-26 NOTE — Telephone Encounter (Signed)
Sent Rx in to pharmacy. 

## 2019-12-27 ENCOUNTER — Other Ambulatory Visit: Payer: Self-pay

## 2019-12-27 ENCOUNTER — Other Ambulatory Visit: Payer: Self-pay | Admitting: Neurology

## 2019-12-27 MED ORDER — RIVASTIGMINE TARTRATE 1.5 MG PO CAPS: 1.5000 mg | ORAL_CAPSULE | Freq: Two times a day (BID) | ORAL | 3 refills | Status: DC

## 2020-03-28 ENCOUNTER — Ambulatory Visit: Payer: Medicare Other | Admitting: Internal Medicine

## 2020-03-28 DIAGNOSIS — Z0289 Encounter for other administrative examinations: Secondary | ICD-10-CM

## 2020-05-17 ENCOUNTER — Ambulatory Visit: Payer: Medicare Other | Admitting: Neurology

## 2020-08-01 ENCOUNTER — Other Ambulatory Visit: Payer: Self-pay | Admitting: Internal Medicine

## 2020-08-05 ENCOUNTER — Other Ambulatory Visit: Payer: Self-pay | Admitting: Neurology

## 2021-06-19 ENCOUNTER — Other Ambulatory Visit: Payer: Self-pay | Admitting: Neurology

## 2021-08-15 ENCOUNTER — Other Ambulatory Visit: Payer: Self-pay | Admitting: Internal Medicine

## 2022-04-10 DEATH — deceased
